# Patient Record
Sex: Female | Born: 1995 | Race: White | Hispanic: No | Marital: Single | Smoking: Never smoker
Health system: Southern US, Community
[De-identification: ages and names within clinical notes are randomized; demographics above are authoritative.]

## PROBLEM LIST (undated history)

## (undated) DIAGNOSIS — F32A Depression, unspecified: Secondary | ICD-10-CM

## (undated) DIAGNOSIS — F419 Anxiety disorder, unspecified: Secondary | ICD-10-CM

## (undated) DIAGNOSIS — I471 Supraventricular tachycardia, unspecified: Secondary | ICD-10-CM

## (undated) DIAGNOSIS — O039 Complete or unspecified spontaneous abortion without complication: Secondary | ICD-10-CM

## (undated) DIAGNOSIS — F909 Attention-deficit hyperactivity disorder, unspecified type: Secondary | ICD-10-CM

## (undated) DIAGNOSIS — R51 Headache: Secondary | ICD-10-CM

## (undated) DIAGNOSIS — F913 Oppositional defiant disorder: Secondary | ICD-10-CM

## (undated) DIAGNOSIS — F329 Major depressive disorder, single episode, unspecified: Secondary | ICD-10-CM

## (undated) DIAGNOSIS — I10 Essential (primary) hypertension: Secondary | ICD-10-CM

## (undated) HISTORY — DX: Headache: R51

## (undated) HISTORY — DX: Depression, unspecified: F32.A

## (undated) HISTORY — DX: Major depressive disorder, single episode, unspecified: F32.9

## (undated) HISTORY — DX: Complete or unspecified spontaneous abortion without complication: O03.9

## (undated) HISTORY — DX: Anxiety disorder, unspecified: F41.9

## (undated) HISTORY — DX: Oppositional defiant disorder: F91.3

## (undated) HISTORY — DX: Attention-deficit hyperactivity disorder, unspecified type: F90.9

## (undated) HISTORY — DX: Supraventricular tachycardia, unspecified: I47.10

## (undated) HISTORY — PX: NO PAST SURGERIES: SHX2092

---
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
## 2016-11-12 ENCOUNTER — Encounter: Payer: Self-pay | Admitting: Orthopedic Surgery

## 2016-12-10 ENCOUNTER — Other Ambulatory Visit (HOSPITAL_COMMUNITY)
Admission: RE | Admit: 2016-12-10 | Discharge: 2016-12-10 | Disposition: A | Discharge status: Home / Self Care | Payer: Medicaid Other | Source: Ambulatory Visit | Attending: Family Medicine | Admitting: Family Medicine

## 2016-12-10 DIAGNOSIS — R69 Illness, unspecified: Secondary | ICD-10-CM | POA: Insufficient documentation

## 2016-12-10 LAB — CBC WITH DIFFERENTIAL/PLATELET
BASOS ABS: 0.8 10*3/uL — AB (ref 0.0–0.1)
Basophils Relative: 5 %
EOS ABS: 0.1 10*3/uL (ref 0.0–0.7)
Eosinophils Relative: 0 %
HCT: 40.6 % (ref 36.0–46.0)
HEMOGLOBIN: 13.3 g/dL (ref 12.0–15.0)
Lymphocytes Relative: 65 %
Lymphs Abs: 10.8 10*3/uL — ABNORMAL HIGH (ref 0.7–4.0)
MCH: 28.4 pg (ref 26.0–34.0)
MCHC: 32.8 g/dL (ref 30.0–36.0)
MCV: 86.8 fL (ref 78.0–100.0)
Monocytes Absolute: 1.7 10*3/uL — ABNORMAL HIGH (ref 0.1–1.0)
Monocytes Relative: 10 %
NEUTROS ABS: 3.3 10*3/uL (ref 1.7–7.7)
NEUTROS PCT: 20 %
PLATELETS: 222 10*3/uL (ref 150–400)
RBC: 4.68 MIL/uL (ref 3.87–5.11)
RDW: 14.7 % (ref 11.5–15.5)
WBC: 16.7 10*3/uL — ABNORMAL HIGH (ref 4.0–10.5)

## 2016-12-10 LAB — MONONUCLEOSIS SCREEN: MONO SCREEN: POSITIVE — AB

## 2017-06-29 ENCOUNTER — Other Ambulatory Visit (HOSPITAL_COMMUNITY): Payer: Self-pay | Admitting: Family Medicine

## 2017-06-29 DIAGNOSIS — R1011 Right upper quadrant pain: Secondary | ICD-10-CM

## 2017-07-20 ENCOUNTER — Ambulatory Visit (HOSPITAL_COMMUNITY)
Admission: RE | Admit: 2017-07-20 | Discharge: 2017-07-20 | Disposition: A | Discharge status: Home / Self Care | Payer: Medicaid Other | Source: Ambulatory Visit | Attending: Family Medicine | Admitting: Family Medicine

## 2017-07-20 DIAGNOSIS — R1011 Right upper quadrant pain: Secondary | ICD-10-CM | POA: Diagnosis not present

## 2017-10-06 ENCOUNTER — Encounter: Payer: Self-pay | Admitting: Orthopaedic Surgery

## 2017-12-05 ENCOUNTER — Encounter

## 2017-12-05 ENCOUNTER — Ambulatory Visit: Payer: Medicaid Other | Admitting: Orthopedic Surgery

## 2017-12-05 ENCOUNTER — Other Ambulatory Visit: Payer: Self-pay | Admitting: Orthopedic Surgery

## 2017-12-05 ENCOUNTER — Encounter: Payer: Self-pay | Admitting: Orthopedic Surgery

## 2017-12-05 ENCOUNTER — Ambulatory Visit (INDEPENDENT_AMBULATORY_CARE_PROVIDER_SITE_OTHER): Payer: Medicaid Other

## 2017-12-05 ENCOUNTER — Ambulatory Visit: Payer: Medicaid Other

## 2017-12-05 VITALS — BP 115/73 | HR 81 | Ht 62.5 in | Wt 254.0 lb

## 2017-12-05 DIAGNOSIS — M25571 Pain in right ankle and joints of right foot: Secondary | ICD-10-CM | POA: Diagnosis not present

## 2017-12-05 DIAGNOSIS — M76822 Posterior tibial tendinitis, left leg: Secondary | ICD-10-CM

## 2017-12-05 DIAGNOSIS — M79672 Pain in left foot: Secondary | ICD-10-CM

## 2017-12-05 DIAGNOSIS — Z6841 Body Mass Index (BMI) 40.0 and over, adult: Secondary | ICD-10-CM

## 2017-12-05 NOTE — Progress Notes (Signed)
NEW PATIENT OFFICE VISIT  Chief Complaint  Patient presents with  . Foot Pain    Left foot pain for years getting worse in past month    HPI 22 year old female injured her ankle it several months ago then since that time she is noted medial knee pain and swelling occasional severe pain occasional giving way presents for evaluation and treatment.  Rates her pain at its worst 8 ROS  Denies numbness tingling complains of some weakness in the lower extremity history of patellofemoral dislocation subluxation Past Medical History:  Diagnosis Date  . ADHD (attention deficit hyperactivity disorder)   . Anxiety   . Depression   . Headache(784.0)   . Oppositional defiant disorder     No past surgical history on file.  Family History  Problem Relation Age of Onset  . Bipolar disorder Mother   . Anxiety disorder Mother   . Arthritis Mother   . Drug abuse Mother   . Physical abuse Mother   . Depression Maternal Aunt   . Depression Maternal Grandmother   . Heart disease Maternal Grandmother   . Cancer Maternal Grandmother   . Arthritis Unknown   . Cancer Unknown   . Asthma Unknown   . Diabetes Unknown   . Kidney disease Unknown   . Heart disease Maternal Grandfather   . Alcohol abuse Maternal Grandfather        MGGF  . Dementia Maternal Grandfather   . OCD Maternal Grandfather        hording  . ADD / ADHD Cousin   . Paranoid behavior Neg Hx   . Schizophrenia Neg Hx   . Seizures Neg Hx   . Sexual abuse Neg Hx    Social History   Tobacco Use  . Smoking status: Never Smoker  . Smokeless tobacco: Never Used  Substance Use Topics  . Alcohol use: No  . Drug use: No    No Known Allergies  Current Meds  Medication Sig  . cetirizine (ZYRTEC) 10 MG tablet Take 10 mg by mouth daily.  Marland Kitchen FLUoxetine (PROZAC) 20 MG capsule TK 1 C PO QD FOR DEPRESSION  . FOCALIN XR 20 MG 24 hr capsule TK ONE C PO  QAM  . LOW-OGESTREL 0.3-30 MG-MCG tablet TK 1 T PO QD  .  neomycin-polymyxin-hydrocortisone (CORTISPORIN) 3.5-10000-1 OTIC suspension INSTILL 4 DROPS INTO EACH EAR CANAL QID FOR 5 DAYS    BP 115/73   Pulse 81   Ht 5' 2.5" (1.588 m)   Wt 254 lb (115.2 kg)   LMP 11/22/2017   BMI 45.72 kg/m   Physical Exam  Constitutional: She is oriented to person, place, and time. She appears well-developed and well-nourished.  Neurological: She is alert and oriented to person, place, and time.  Psychiatric: She has a normal mood and affect. Judgment normal.  Vitals reviewed.   Ortho Exam Gait is normal  Both feet seem the same they are both flat they are flexible on tiptoe standing full range of motion bilaterally no instability at either ankle normal strength on the right ankle weakness in the posterior tibial tendon on the left other musculature is normal skin is normal on both legs she has good pulses bilaterally normal sensation in each foot  MEDICAL DECISION SECTION  Xrays were done at [facility] orthopedics show no fracture dislocation or abnormal articular surfaces  My independent reading of xrays:  Right ankle films were done previously they look normal  Encounter Diagnoses  Name Primary?  . Pain  in joint involving right ankle and foot Yes  . Posterior tibial tendon dysfunction (PTTD) of left lower extremity     PLAN: (Rx., injectx, surgery, frx, mri/ct) Recommend [facility] Walker for 6 weeks come back for reevaluation  No orders of the defined types were placed in this encounter.   Fuller Canada, MD  12/05/2017 3:00 PM

## 2017-12-05 NOTE — Patient Instructions (Signed)

## 2018-01-16 ENCOUNTER — Ambulatory Visit: Payer: Medicaid Other | Admitting: Orthopedic Surgery

## 2018-01-16 ENCOUNTER — Encounter: Payer: Self-pay | Admitting: Orthopedic Surgery

## 2018-01-17 ENCOUNTER — Encounter: Payer: Self-pay | Admitting: Cardiovascular Disease

## 2018-06-16 ENCOUNTER — Telehealth: Payer: Self-pay | Admitting: *Deleted

## 2018-06-16 NOTE — Telephone Encounter (Signed)
Spoke with female to let pt know we will call as close to appt time on Monday as possible. JSY

## 2018-06-19 ENCOUNTER — Encounter: Payer: Self-pay | Admitting: Adult Health

## 2018-06-19 ENCOUNTER — Other Ambulatory Visit: Payer: Self-pay

## 2018-06-19 ENCOUNTER — Ambulatory Visit (INDEPENDENT_AMBULATORY_CARE_PROVIDER_SITE_OTHER): Payer: Self-pay | Admitting: Adult Health

## 2018-06-19 VITALS — Ht 62.5 in

## 2018-06-19 DIAGNOSIS — O3680X0 Pregnancy with inconclusive fetal viability, not applicable or unspecified: Secondary | ICD-10-CM

## 2018-06-19 DIAGNOSIS — Z3201 Encounter for pregnancy test, result positive: Secondary | ICD-10-CM

## 2018-06-19 DIAGNOSIS — Z3A01 Less than 8 weeks gestation of pregnancy: Secondary | ICD-10-CM

## 2018-06-19 HISTORY — DX: Encounter for pregnancy test, result positive: Z32.01

## 2018-06-19 HISTORY — DX: Pregnancy with inconclusive fetal viability, not applicable or unspecified: O36.80X0

## 2018-06-19 NOTE — Progress Notes (Signed)
Patient ID: Anna Montgomery, female   DOB: September 19, 1995, 23 y.o.   MRN: [id number]   TELEHEALTH VIRTUAL GYNECOLOGY VISIT ENCOUNTER NOTE  I connected with Anna Montgomery on 06/19/18 at  9:45 AM EDT by telephone at home and verified that I am speaking with the correct person using two identifiers.   I discussed the limitations, risks, security and privacy concerns of performing an evaluation and management service by telephone and the availability of in person appointments. I also discussed with the patient that there may be a patient responsible charge related to this service. The patient expressed understanding and agreed to proceed.   History:  Anna Montgomery is a 23 y.o. G1P0 female being evaluated today for having missed a period and has had 3+HPTs. Her LMP was 05/14/2018, so she is about 5+1 week [pregnant and due about 02/18/19.She has been sleepy, had cramping and breast tenderness, reassured this is symptoms of early pregnancy, along with being moody and having to pee a lot.  Mom was with her and voiced that Anna Montgomery has had elevated BP and borderline sugar issues in the past.Whcih we will address at [facility] visit.  She denies any abnormal vaginal discharge, bleeding, or other concerns.       Past Medical History:  Diagnosis Date  . ADHD (attention deficit hyperactivity disorder)   . Anxiety   . Depression   . Headache(784.0)   . Oppositional defiant disorder    History reviewed. No pertinent surgical history. The following portions of the patient's history were reviewed and updated as appropriate: allergies, current medications, past family history, past medical history, past social history, past surgical history and problem list.   Health Maintenance:  Normal pap at [facility].  Review of Systems:  Pertinent items noted in HPI and remainder of comprehensive ROS otherwise negative. Ht 5' 2.5" (1.588 m)   LMP 05/14/2018   BMI 45.72 kg/m per pt.  Fall risk is low.   Physical Exam:   General:   Alert, oriented and cooperative.   Mental Status: Normal mood and affect perceived. Normal judgment and thought content.  Physical exam deferred due to nature of the encounter  Labs and Imaging No results found for this or any previous visit (from the past 336 hour(s)). No results found.    Assessment and Plan:     1. Positive pregnancy test 3+HPTs  2. Encounter to determine fetal viability of pregnancy, single or unspecified fetus Will schedule dating [place] - US OB Comp Less 14 Wks; Future, ina bout 3 weeks   3. Less than [redacted] weeks gestation of pregnancy Continue PNV Ok to take Prozac Pt aware that deliveries at [facility]       I discussed the assessment and treatment plan with the patient. The patient was provided an opportunity to ask questions and all were answered. The patient agreed with the plan and demonstrated an understanding of the instructions.   The patient was advised to call back or seek an in-person evaluation/go to the ED if the symptoms worsen or if the condition fails to improve as anticipated.  I provided  8 minutes of non-face-to-face time during this encounter.   Cyril Mourning, NP [facility] [employer], [facility]

## 2018-07-10 ENCOUNTER — Other Ambulatory Visit: Payer: Medicaid Other

## 2018-07-11 ENCOUNTER — Other Ambulatory Visit: Payer: Self-pay

## 2018-07-11 ENCOUNTER — Other Ambulatory Visit: Payer: Self-pay | Admitting: Adult Health

## 2018-07-11 ENCOUNTER — Ambulatory Visit (INDEPENDENT_AMBULATORY_CARE_PROVIDER_SITE_OTHER): Payer: Medicaid Other

## 2018-07-11 DIAGNOSIS — O3680X0 Pregnancy with inconclusive fetal viability, not applicable or unspecified: Secondary | ICD-10-CM

## 2018-07-11 DIAGNOSIS — Z3A08 8 weeks gestation of pregnancy: Secondary | ICD-10-CM

## 2018-07-11 NOTE — Progress Notes (Signed)
[place] 6+1 wks,single IUP w/ys,low lying gestational sac,fhr 98 bpm,normal ovaries bilat,CRL 45.9 mm,discussed results with Victorino Dike, patient will come back for f/u ultrasound in 10 days.

## 2018-07-24 ENCOUNTER — Inpatient Hospital Stay (HOSPITAL_COMMUNITY)
Admission: AD | Admit: 2018-07-24 | Discharge: 2018-07-24 | Disposition: A | Discharge status: Home / Self Care | Payer: BLUE CROSS/BLUE SHIELD | Attending: Obstetrics and Gynecology | Admitting: Obstetrics and Gynecology

## 2018-07-24 ENCOUNTER — Inpatient Hospital Stay (HOSPITAL_COMMUNITY): Payer: BLUE CROSS/BLUE SHIELD

## 2018-07-24 ENCOUNTER — Telehealth: Payer: Self-pay | Admitting: *Deleted

## 2018-07-24 ENCOUNTER — Other Ambulatory Visit: Payer: Self-pay

## 2018-07-24 ENCOUNTER — Encounter (HOSPITAL_COMMUNITY): Payer: Self-pay

## 2018-07-24 DIAGNOSIS — O039 Complete or unspecified spontaneous abortion without complication: Secondary | ICD-10-CM | POA: Insufficient documentation

## 2018-07-24 DIAGNOSIS — O209 Hemorrhage in early pregnancy, unspecified: Secondary | ICD-10-CM

## 2018-07-24 DIAGNOSIS — Z3A08 8 weeks gestation of pregnancy: Secondary | ICD-10-CM | POA: Diagnosis not present

## 2018-07-24 HISTORY — DX: Essential (primary) hypertension: I10

## 2018-07-24 LAB — URINALYSIS, ROUTINE W REFLEX MICROSCOPIC
Bilirubin Urine: NEGATIVE
Glucose, UA: NEGATIVE mg/dL
Hgb urine dipstick: NEGATIVE
Ketones, ur: NEGATIVE mg/dL
Nitrite: NEGATIVE
Protein, ur: NEGATIVE mg/dL
Specific Gravity, Urine: 1.015 (ref 1.005–1.030)
pH: 6 (ref 5.0–8.0)

## 2018-07-24 LAB — WET PREP, GENITAL
Sperm: NONE SEEN
Trich, Wet Prep: NONE SEEN
Yeast Wet Prep HPF POC: NONE SEEN

## 2018-07-24 MED ORDER — HYDROXYZINE PAMOATE 50 MG PO CAPS: 50.0000 mg | ORAL_CAPSULE | Freq: Three times a day (TID) | ORAL | 0 refills | Status: DC | PRN

## 2018-07-24 MED ORDER — IBUPROFEN 600 MG PO TABS: 600.0000 mg | ORAL_TABLET | Freq: Four times a day (QID) | ORAL | 0 refills | Status: DC | PRN

## 2018-07-24 NOTE — MAU Note (Signed)
Pt reports some light vag. Bleeding for the past week.  Pt also reports some left sided abd cramping.  Pt denies any recent intercourse.  Pt reports lifting heavy objects at work last week before bleeding started.

## 2018-07-24 NOTE — Discharge Instructions (Signed)
Miscarriage  A miscarriage is the loss of an unborn baby (fetus) before the 20th week of pregnancy.  Follow these instructions at home:  Medicines    · Take over-the-counter and prescription medicines only as told by your doctor.  · If you were prescribed antibiotic medicine, take it as told by your doctor. Do not stop taking the antibiotic even if you start to feel better.  · Do not take NSAIDs unless your doctor says that this is safe for you. NSAIDs include aspirin and ibuprofen. These medicines can cause bleeding.  Activity  · Rest as directed. Ask your doctor what activities are safe for you.  · Have someone help you at home during this time.  General instructions  · Write down how many pads you use each day and how soaked they are.  · Watch the amount of tissue or clumps of blood (blood clots) that you pass from your vagina. Save any large amounts of tissue for your doctor.  · Do not use tampons, douche, or have sex until your doctor approves.  · To help you and your partner with the process of grieving, talk with your doctor or seek counseling.  · When you are ready, meet with your doctor to talk about steps you should take for your health. Also, talk with your doctor about steps to take to have a healthy pregnancy in the future.  · Keep all follow-up visits as told by your doctor. This is important.  Contact a doctor if:  · You have a fever or chills.  · You have vaginal discharge that smells bad.  · You have more bleeding.  Get help right away if:  · You have very bad cramps or pain in your back or belly.  · You pass clumps of blood that are walnut-sized or larger from your vagina.  · You pass tissue that is walnut-sized or larger from your vagina.  · You soak more than 1 regular pad in an hour.  · You get light-headed or weak.  · You faint (pass out).  · You have feelings of sadness that do not go away, or you have thoughts of hurting yourself.  Summary  · A miscarriage is the loss of an unborn baby before  the 20th week of pregnancy.  · Follow your doctor's instructions for home care. Keep all follow-up appointments.  · To help you and your partner with the process of grieving, talk with your doctor or seek counseling.  This information is not intended to replace advice given to you by your health care provider. Make sure you discuss any questions you have with your health care provider.  Document Released: 04/19/2011 Document Revised: 03/02/2016 Document Reviewed: 03/02/2016  Elsevier Interactive Patient Education © 2019 [employer]

## 2018-07-24 NOTE — Telephone Encounter (Signed)
Left message letting pt know no visitors or children at tomorrow's appt. Call office for check in. Wear a mask. Advised if pt has come in contact with someone in the last month that has been confirmed or suspected of having Covid-19 or if she is experiencing any symptoms, call office and reschedule. Wood

## 2018-07-24 NOTE — MAU Provider Note (Signed)
History     CSN: [id number]  Arrival date and time: 07/24/18 1338   First Provider Initiated Contact with Patient 07/24/18 1508      Chief Complaint  Patient presents with  . Vaginal Bleeding  . Abdominal Pain   HPI   Anna Montgomery is a 23 y.o. female G1P0 @ 7854w0d here in MAU with complaints of abnormal vaginal bleeding. Says over the last week she has noticed spotting every time she uses the bathroom. The spotting is dark, and very light. She has had cramping on her left side only. The cramping is present everyday. She currently rates her pain 5/10.  US in the office on 6/2 shows IUP with cardiac activity.   OB History    Gravida  1   Para      Term      Preterm      AB      Living        SAB      TAB      Ectopic      Multiple      Live Births              Past Medical History:  Diagnosis Date  . ADHD (attention deficit hyperactivity disorder)   . Anxiety   . Depression   . Headache(784.0)   . Hypertension   . Oppositional defiant disorder     Past Surgical History:  Procedure Laterality Date  . NO PAST SURGERIES      Family History  Problem Relation Age of Onset  . Bipolar disorder Mother   . Anxiety disorder Mother   . Arthritis Mother   . Drug abuse Mother   . Physical abuse Mother   . Depression Maternal Aunt   . Depression Maternal Grandmother   . Heart disease Maternal Grandmother   . Cancer Maternal Grandmother   . Arthritis Other   . Cancer Other   . Asthma Other   . Diabetes Other   . Kidney disease Other   . Heart disease Maternal Grandfather   . Alcohol abuse Maternal Grandfather        MGGF  . Dementia Maternal Grandfather   . OCD Maternal Grandfather        hording  . ADD / ADHD Cousin   . Paranoid behavior Neg Hx   . Schizophrenia Neg Hx   . Seizures Neg Hx   . Sexual abuse Neg Hx     Social History   Tobacco Use  . Smoking status: Never Smoker  . Smokeless tobacco: Never Used  Substance Use Topics  .  Alcohol use: No  . Drug use: No    Allergies: No Known Allergies  Medications Prior to Admission  Medication Sig Dispense Refill Last Dose  . cetirizine (ZYRTEC) 10 MG tablet Take 10 mg by mouth daily.     Marland Kitchen. FLUoxetine (PROZAC) 20 MG capsule TK 1 C PO QD FOR DEPRESSION  11   . neomycin-polymyxin-hydrocortisone (CORTISPORIN) 3.5-10000-1 OTIC suspension as needed.   2   . Prenatal Vit-Fe Fumarate-FA (PRENATAL VITAMIN PO) [facility] daily. [facility]      Results for orders placed or performed during the hospital encounter of 07/24/18 (from the past 48 hour(s))  Wet prep, genital     Status: Abnormal   Collection Time: 07/24/18  3:20 PM   Specimen: Vaginal  Result Value Ref Range   Yeast Wet Prep HPF POC NONE SEEN NONE SEEN   Trich, Wet  Prep NONE SEEN NONE SEEN   Clue Cells Wet Prep HPF POC PRESENT (A) NONE SEEN   WBC, Wet Prep HPF POC MANY (A) NONE SEEN   Sperm NONE SEEN     Comment: Performed at [facility], [address] [place], [place] [address]  Urinalysis, Routine w reflex microscopic     Status: Abnormal   Collection Time: 07/24/18  3:36 PM  Result Value Ref Range   Color, Urine YELLOW YELLOW   APPearance CLEAR CLEAR   Specific Gravity, Urine 1.015 1.005 - 1.030   pH 6.0 5.0 - 8.0   Glucose, UA NEGATIVE NEGATIVE mg/dL   Hgb urine dipstick NEGATIVE NEGATIVE   Bilirubin Urine NEGATIVE NEGATIVE   Ketones, ur NEGATIVE NEGATIVE mg/dL   Protein, ur NEGATIVE NEGATIVE mg/dL   Nitrite NEGATIVE NEGATIVE   Leukocytes,Ua TRACE (A) NEGATIVE   RBC / HPF 0-5 0 - 5 RBC/hpf   WBC, UA 0-5 0 - 5 WBC/hpf   Bacteria, UA RARE (A) NONE SEEN   Squamous Epithelial / LPF 0-5 0 - 5    Comment: Performed at [facility], [address] [place], [place] [address]  ABO/Rh     Status: None   Collection Time: 07/24/18  3:37 PM  Result Value Ref Range   ABO/RH(D) B POS    No rh immune globuloin      NOT A RH IMMUNE GLOBULIN CANDIDATE, PT RH POSITIVE Performed at [facility], [address] [place], [place] [address]    [place] Ob Transvaginal  Result Date: 07/24/2018 CLINICAL DATA:  Vaginal bleeding.  Last menstrual period 05/14/2018. EXAM: TRANSVAGINAL OB ULTRASOUND TECHNIQUE: Transvaginal ultrasound was performed for complete evaluation of the gestation as well as the maternal uterus, adnexal regions, and pelvic cul-de-sac. COMPARISON:  None. FINDINGS: Intrauterine gestational sac: None Yolk sac:  Not Visualized. Embryo:  Not Visualized. Maternal uterus/adnexae: The endometrial stripe is heterogeneous and thickened to 1.5 cm. Normal appearance of the ovaries. 1.5 cm simple appearing left adnexal cyst noted. IMPRESSION: Heterogeneous thickened endometrial stripe without intrauterine gestational sac identified, probably representing failed pregnancy. Clinical follow-up is recommended. Electronically Signed   By: Ted Mcalpineobrinka  Dimitrova M.D.   On: 07/24/2018 16:40    Review of Systems  Constitutional: Negative for fever.  Gastrointestinal: Negative for abdominal pain.  Genitourinary: Negative for vaginal bleeding.   Physical Exam   Blood pressure 135/63, pulse (!) 111, temperature 98.5 F (36.9 C), temperature source Oral, resp. rate 20, height 5\' 2"  (1.575 m), weight 121.3 kg, last menstrual period 05/14/2018, SpO2 99 %.  Physical Exam  Constitutional: She is oriented to person, place, and time. She appears well-developed and well-nourished. No distress.  HENT:  Head: Normocephalic.  Eyes: Pupils are equal, round, and reactive to light.  GI: Soft. She exhibits no distension. There is no abdominal tenderness. There is no rebound and no guarding.  Genitourinary:    Genitourinary Comments: Cervix Soft, anterior, closed. Small amount of dark red blood noted on exam glove.    Musculoskeletal: Normal range of motion.  Neurological: She is alert and oriented to person, place, and time.  Skin: Skin is warm. She is not diaphoretic.  Psychiatric: Her behavior is  normal.   MAU Course  Procedures  None  MDM B positive blood type CBC US today. HCG  Assessment and Plan   A:  1. SAB (spontaneous abortion)   2. Vaginal bleeding in pregnancy, first trimester     P:  Discharge home in stable condition RX: Vistaril, Ibuprofen  Return to MAU if symptoms worsen Bleeding precautions Support given  I sent a message to [facility] OB office to schedule a F/U appointment for the patient in the next week.   Chellsie Gomer 07/24/2018, 3:10 PM

## 2018-07-25 ENCOUNTER — Other Ambulatory Visit: Payer: Medicaid Other

## 2018-07-25 ENCOUNTER — Ambulatory Visit: Payer: Medicaid Other | Admitting: Adult Health

## 2018-07-25 LAB — ABO/RH: ABO/RH(D): B POS

## 2018-07-26 LAB — GC/CHLAMYDIA PROBE AMP (~~LOC~~) NOT AT ARMC
Chlamydia: NEGATIVE
Neisseria Gonorrhea: NEGATIVE

## 2018-07-27 ENCOUNTER — Other Ambulatory Visit: Payer: Self-pay

## 2018-07-27 ENCOUNTER — Encounter: Payer: Self-pay | Admitting: Adult Health

## 2018-07-27 ENCOUNTER — Ambulatory Visit (INDEPENDENT_AMBULATORY_CARE_PROVIDER_SITE_OTHER): Payer: Medicaid Other | Admitting: Adult Health

## 2018-07-27 VITALS — BP 142/88 | HR 88 | Ht 62.5 in | Wt 272.0 lb

## 2018-07-27 DIAGNOSIS — O039 Complete or unspecified spontaneous abortion without complication: Secondary | ICD-10-CM

## 2018-07-27 DIAGNOSIS — R1032 Left lower quadrant pain: Secondary | ICD-10-CM

## 2018-07-27 DIAGNOSIS — N83202 Unspecified ovarian cyst, left side: Secondary | ICD-10-CM

## 2018-07-27 HISTORY — DX: Complete or unspecified spontaneous abortion without complication: O03.9

## 2018-07-27 NOTE — Progress Notes (Signed)
Patient ID: Anna Montgomery, female   DOB: 03/13/1995, 23 y.o.   MRN: [id number] History of Present Illness: Anna Montgomery is a 23 year old black female, G1P0010, in follow up of having miscarriage, she was seen 6/15 at MAU and US showed thickened endometrium no IUP and simple adnexal cyst, still bleeding. She is complaining of pain in left side. Blood type is B+. PCP is Dr Karie Kirks.   Current Medications, Allergies, Past Medical History, Past Surgical History, Family History and Social History were reviewed in [employer] record.     Review of Systems: Pain in left side Still bleeding     Physical Exam:BP (!) 142/88 (BP Location: Left Arm, Patient Position: Sitting, Cuff Size: Normal)   Pulse 88   Ht 5' 2.5" (1.588 m)   Wt 272 lb (123.4 kg)   LMP 05/14/2018   BMI 48.96 kg/m  General:  Well developed, well nourished, no acute distress Skin:  Warm and dry Pelvic:  External genitalia is normal in appearance, no lesions.  The vagina is normal in appearance. Urethra has no lesions or masses. The cervix is smooth.  Uterus is felt to be normal size, shape, and contour.  No adnexal masses, mild LLQ tenderness noted.Bladder is non tender, no masses felt. Psych:  No mood changes, alert and cooperative,seems happy Fall risk is low. Examination chaperoned by Levy Pupa LPN. Explained that miscarriages can happen in early pregnancy and it is nobody's fault and can't be prevented, this early.   Impression: 1. SAB (spontaneous abortion)   2. LLQ pain   3. Left ovarian cyst       Plan: Check [facility] today Return in 2 weeks for [facility] Note given for work to excuse, 6/15,6/16,6/17 and RTW 6/19, limit lifting to 25 lbs. Use condoms Review handout by Tilden Fossa on Miscarriage

## 2018-07-28 ENCOUNTER — Telehealth: Payer: Self-pay | Admitting: *Deleted

## 2018-07-28 LAB — BETA HCG QUANT (REF LAB): hCG Quant: 6993 m[IU]/mL

## 2018-07-28 NOTE — Telephone Encounter (Signed)
LMOVM to come have repeat hcg drawn on Monday.  Placed on lab schedule.

## 2018-07-31 ENCOUNTER — Other Ambulatory Visit: Payer: Medicaid Other

## 2018-07-31 ENCOUNTER — Telehealth: Payer: Self-pay | Admitting: Adult Health

## 2018-07-31 NOTE — Telephone Encounter (Signed)
Patient states she went to [facility] for bleeding and was found to have had a complete miscarriage. HCG 4478. Informed she is indeed having a miscarriage and we need to follow lab until <5.  Already scheduled for next week, so we will just leave her as scheduled.  Verbalized understanding.

## 2018-07-31 NOTE — Telephone Encounter (Signed)
Pt states she went to [facility] last night and they states she had a complete miscarriage but that her hcg levels were high and they recommend her not to take ibprophen. Pt would like to discuss with a nurse.

## 2018-08-07 ENCOUNTER — Telehealth: Payer: Self-pay | Admitting: Women's Health

## 2018-08-07 ENCOUNTER — Telehealth: Payer: Self-pay | Admitting: *Deleted

## 2018-08-07 NOTE — Telephone Encounter (Signed)
Patient states she has missed work due to pain related to her cyst on her ovary and needs a note. Will pick up this afternoon.

## 2018-08-07 NOTE — Telephone Encounter (Signed)
Patient called stating that she would like to know if we could write her a letter for work, pt states that she has had a miscarriage and her bleeding has slowed down but she is still having pain. Please contact pt

## 2018-08-10 ENCOUNTER — Other Ambulatory Visit: Payer: Medicaid Other

## 2018-08-10 ENCOUNTER — Other Ambulatory Visit: Payer: Self-pay

## 2018-08-10 DIAGNOSIS — O039 Complete or unspecified spontaneous abortion without complication: Secondary | ICD-10-CM

## 2018-08-11 LAB — BETA HCG QUANT (REF LAB): hCG Quant: 405 m[IU]/mL

## 2018-08-14 ENCOUNTER — Telehealth: Payer: Self-pay | Admitting: *Deleted

## 2018-08-14 NOTE — Telephone Encounter (Signed)
LMOVM that she needs to come back in 2 weeks for HCG. Dropped to 405.

## 2020-05-13 ENCOUNTER — Other Ambulatory Visit: Payer: Self-pay | Admitting: Family Medicine

## 2020-05-13 DIAGNOSIS — R10811 Right upper quadrant abdominal tenderness: Secondary | ICD-10-CM

## 2020-05-15 ENCOUNTER — Ambulatory Visit (HOSPITAL_COMMUNITY)
Admission: RE | Admit: 2020-05-15 | Discharge: 2020-05-15 | Disposition: A | Discharge status: Home / Self Care | Payer: Medicaid Other | Source: Ambulatory Visit | Attending: Family Medicine | Admitting: Family Medicine

## 2020-05-15 DIAGNOSIS — R10811 Right upper quadrant abdominal tenderness: Secondary | ICD-10-CM | POA: Insufficient documentation

## 2020-05-23 ENCOUNTER — Other Ambulatory Visit (HOSPITAL_COMMUNITY): Payer: Self-pay | Admitting: Family Medicine

## 2020-05-23 DIAGNOSIS — R1013 Epigastric pain: Secondary | ICD-10-CM

## 2020-06-04 ENCOUNTER — Other Ambulatory Visit (HOSPITAL_COMMUNITY): Payer: Self-pay | Admitting: Family Medicine

## 2020-06-04 ENCOUNTER — Ambulatory Visit (HOSPITAL_COMMUNITY)
Admission: RE | Admit: 2020-06-04 | Discharge: 2020-06-04 | Disposition: A | Discharge status: Home / Self Care | Payer: Medicaid Other | Source: Ambulatory Visit | Attending: Family Medicine | Admitting: Family Medicine

## 2020-06-04 ENCOUNTER — Encounter (HOSPITAL_COMMUNITY): Payer: Self-pay

## 2020-06-04 DIAGNOSIS — R1013 Epigastric pain: Secondary | ICD-10-CM | POA: Insufficient documentation

## 2020-06-04 MED ORDER — MORPHINE SULFATE (PF) 4 MG/ML IV SOLN
4.0000 mg | Freq: Once | INTRAVENOUS | Status: AC
  Administered 2020-06-04: 4 mg via INTRAVENOUS
  Filled 2020-06-04: qty 1

## 2020-06-04 MED ORDER — TECHNETIUM TC 99M MEBROFENIN IV KIT
5.0000 | PACK | Freq: Once | INTRAVENOUS | Status: AC | PRN
  Administered 2020-06-04: 5 via INTRAVENOUS

## 2020-06-04 MED ORDER — MORPHINE SULFATE (PF) 4 MG/ML IV SOLN
INTRAVENOUS | Status: AC
  Filled 2020-06-04: qty 1

## 2020-06-24 ENCOUNTER — Ambulatory Visit: Payer: Medicaid Other | Admitting: General Surgery

## 2020-07-08 ENCOUNTER — Ambulatory Visit (INDEPENDENT_AMBULATORY_CARE_PROVIDER_SITE_OTHER): Payer: Medicaid Other | Admitting: General Surgery

## 2020-07-08 ENCOUNTER — Encounter: Payer: Self-pay | Admitting: General Surgery

## 2020-07-08 ENCOUNTER — Other Ambulatory Visit: Payer: Self-pay

## 2020-07-08 VITALS — BP 136/81 | HR 83 | Temp 98.9°F | Resp 16 | Ht 62.5 in | Wt 298.0 lb

## 2020-07-08 DIAGNOSIS — K8020 Calculus of gallbladder without cholecystitis without obstruction: Secondary | ICD-10-CM

## 2020-07-08 NOTE — Patient Instructions (Signed)
Minimally Invasive Cholecystectomy Minimally invasive cholecystectomy is surgery to remove the gallbladder. The gallbladder is a pear-shaped organ that lies beneath the liver on the right side of the body. The gallbladder stores bile, which is a fluid that helps the body digest fats. Cholecystectomy is often done to treat inflammation of the gallbladder (cholecystitis). This condition is usually caused by a buildup of gallstones (cholelithiasis) in the gallbladder. Gallstones can block the flow of bile, which can result in inflammation and pain. In severe cases, emergency surgery may be required. This procedure is done though small incisions in the abdomen, instead of one large incision. It is also called laparoscopic surgery. A thin scope with a camera (laparoscope) is inserted through one incision. Then surgical instruments are inserted through the other incisions. In some cases, a minimally invasive surgery may need to be changed to a surgery that is done through a larger incision. This is called open surgery. Tell a health care provider about:  Any allergies you have.  All medicines you are taking, including vitamins, herbs, eye drops, creams, and over-the-counter medicines.  Any problems you or family members have had with anesthetic medicines.  Any blood disorders you have.  Any surgeries you have had.  Any medical conditions you have.  Whether you are pregnant or may be pregnant. What are the risks? Generally, this is a safe procedure. However, problems may occur, including:  Infection.  Bleeding.  Allergic reactions to medicines.  Damage to nearby structures or organs.  A stone remaining in the common bile duct. The common bile duct carries bile from the gallbladder into the small intestine.  A bile leak from the cyst duct that is clipped when your gallbladder is removed. What happens before the procedure? Medicines Ask your health care provider about:  Changing or  stopping your regular medicines. This is especially important if you are taking diabetes medicines or blood thinners.  Taking medicines such as aspirin and ibuprofen. These medicines can thin your blood. Do not take these medicines unless your health care provider tells you to take them.  Taking over-the-counter medicines, vitamins, herbs, and supplements. General instructions  Let your health care provider know if you develop a cold or an infection before surgery.  Plan to have someone take you home from the hospital or clinic.  If you will be going home right after the procedure, plan to have someone with you for 24 hours.  Ask your health care provider: ? How your surgery site will be marked. ? What steps will be taken to help prevent infection. These may include:  Removing hair at the surgery site.  Washing skin with a germ-killing soap.  Taking antibiotic medicine. What happens during the procedure?  An IV will be inserted into one of your veins.  You will be given one or both of the following: ? A medicine to help you relax (sedative). ? A medicine to make you fall asleep (general anesthetic).  A breathing tube will be placed in your mouth.  Your surgeon will make several small incisions in your abdomen.  The laparoscope will be inserted through one of the small incisions. The camera on the laparoscope will send images to a monitor in the operating room. This lets your surgeon see inside your abdomen.  A gas will be pumped into your abdomen. This will expand your abdomen to give the surgeon more room to perform the surgery.  Other tools that are needed for the procedure will be inserted through the   other incisions. The gallbladder will be removed through one of the incisions.  Your common bile duct may be examined. If stones are found in the common bile duct, they may be removed.  After your gallbladder has been removed, the incisions will be closed with stitches  (sutures), staples, or skin glue.  Your incisions may be covered with a bandage (dressing). The procedure may vary among health care providers and hospitals.   What happens after the procedure?  Your blood pressure, heart rate, breathing rate, and blood oxygen level will be monitored until you leave the hospital or clinic.  You will be given medicines as needed to control your pain.  If you were given a sedative during the procedure, it can affect you for several hours. Do not drive or operate machinery until your health care provider says that it is safe. Summary  Minimally invasive cholecystectomy, also called laparoscopic cholecystectomy, is surgery to remove the gallbladder using small incisions.  Tell your health care provider about all the medical conditions you have and all the medicines you are taking for those conditions.  Before the procedure, follow instructions about eating or drinking restrictions and changing or stopping medicines.  If you were given a sedative during the procedure, it can affect you for several hours. Do not drive or operate machinery until your health care provider says that it is safe. This information is not intended to replace advice given to you by your health care provider. Make sure you discuss any questions you have with your health care provider. Document Revised: 10/30/2018 Document Reviewed: 10/30/2018 Elsevier Patient Education  2021 Elsevier Inc.  

## 2020-07-09 NOTE — H&P (Signed)
Anna Montgomery; [id number]; 19-May-1995   HPI Patient is a 25 year old black female who was referred to my care by Dr. Gareth Morgan for evaluation and treatment of biliary colic secondary to cholelithiasis.  Patient has been having episodes of reflux, epigastric bloating, right upper quadrant abdominal pain which radiates around to the right shoulder for many months.  She has been seen in the emergency room in the past.  Ultrasound the gallbladder revealed cholelithiasis with a contracted gallbladder.  She did have a HIDA scan which showed nonfilling of the gallbladder.  She states the episodes are intermittent in nature.  Some fatty foods make it worse.  She states that Nexium has been helpful.  She denies any fever, chills, or jaundice.  She currently is asymptomatic.  Patient is currently breast-feeding Past Medical History:  Diagnosis Date  . ADHD (attention deficit hyperactivity disorder)   . Anxiety   . Depression   . Headache(784.0)   . Hypertension   . Miscarriage   . Oppositional defiant disorder     Past Surgical History:  Procedure Laterality Date  . NO PAST SURGERIES      Family History  Problem Relation Age of Onset  . Bipolar disorder Mother   . Anxiety disorder Mother   . Arthritis Mother   . Drug abuse Mother   . Physical abuse Mother   . Depression Maternal Aunt   . Depression Maternal Grandmother   . Heart disease Maternal Grandmother   . Cancer Maternal Grandmother   . Arthritis Other   . Cancer Other   . Asthma Other   . Diabetes Other   . Kidney disease Other   . Heart disease Maternal Grandfather   . Alcohol abuse Maternal Grandfather        MGGF  . Dementia Maternal Grandfather   . OCD Maternal Grandfather        hording  . ADD / ADHD Cousin   . Paranoid behavior Neg Hx   . Schizophrenia Neg Hx   . Seizures Neg Hx   . Sexual abuse Neg Hx     Current Outpatient Medications on File Prior to Visit  Medication Sig Dispense Refill  . Alum  Hydroxide-Mag Carbonate (GAVISCON EXTRA STRENGTH) 160-105 MG CHEW Chew by mouth.    . esomeprazole (NEXIUM) 20 MG capsule Take 40 mg by mouth daily at 12 noon.     No current facility-administered medications on file prior to visit.    No Known Allergies  Social History   Substance and Sexual Activity  Alcohol Use No    Social History   Tobacco Use  Smoking Status Never Smoker  Smokeless Tobacco Never Used    Review of Systems  Constitutional: Positive for malaise/fatigue.  HENT: Negative.   Eyes: Negative.   Respiratory: Negative.   Cardiovascular: Negative.   Gastrointestinal: Positive for abdominal pain, heartburn and nausea.  Genitourinary: Positive for frequency.  Skin: Negative.   Neurological: Negative.   Endo/Heme/Allergies: Negative.   Psychiatric/Behavioral: Negative.     Objective   Vitals:   07/08/20 1325  BP: 136/81  Pulse: 83  Resp: 16  Temp: 98.9 F (37.2 C)  SpO2: 94%    Physical Exam Vitals reviewed.  Constitutional:      Appearance: Normal appearance. She is obese. She is not ill-appearing.  HENT:     Head: Normocephalic and atraumatic.  Eyes:     General: No scleral icterus. Cardiovascular:     Rate and Rhythm: Normal rate and regular rhythm.  Heart sounds: Normal heart sounds. No murmur heard. No friction rub. No gallop.   Pulmonary:     Effort: Pulmonary effort is normal. No respiratory distress.     Breath sounds: Normal breath sounds. No stridor. No wheezing, rhonchi or rales.  Abdominal:     General: Bowel sounds are normal. There is no distension.     Palpations: Abdomen is soft. There is no mass.     Tenderness: There is no abdominal tenderness. There is no guarding or rebound.     Hernia: No hernia is present.  Skin:    General: Skin is warm and dry.  Neurological:     Mental Status: She is alert and oriented to person, place, and time.    Primary care notes reviewed.  Ultrasound and HIDA scan reports  reviewed Assessment  Biliary colic secondary to cholelithiasis Plan   Patient is scheduled for laparoscopic cholecystectomy on 08/04/2020.  The risks and benefits of the procedure including bleeding, infection, hepatobiliary injury, and the possibility of an open procedure were fully explained to the patient, who gave informed consent.  She has been instructed about pumping and storing breastmilk prior to the surgery.

## 2020-07-09 NOTE — Progress Notes (Signed)
Anna Montgomery; [id number]; 19-May-1995   HPI Patient is a 25 year old black female who was referred to my care by Dr. Gareth Morgan for evaluation and treatment of biliary colic secondary to cholelithiasis.  Patient has been having episodes of reflux, epigastric bloating, right upper quadrant abdominal pain which radiates around to the right shoulder for many months.  She has been seen in the emergency room in the past.  Ultrasound the gallbladder revealed cholelithiasis with a contracted gallbladder.  She did have a HIDA scan which showed nonfilling of the gallbladder.  She states the episodes are intermittent in nature.  Some fatty foods make it worse.  She states that Nexium has been helpful.  She denies any fever, chills, or jaundice.  She currently is asymptomatic.  Patient is currently breast-feeding Past Medical History:  Diagnosis Date  . ADHD (attention deficit hyperactivity disorder)   . Anxiety   . Depression   . Headache(784.0)   . Hypertension   . Miscarriage   . Oppositional defiant disorder     Past Surgical History:  Procedure Laterality Date  . NO PAST SURGERIES      Family History  Problem Relation Age of Onset  . Bipolar disorder Mother   . Anxiety disorder Mother   . Arthritis Mother   . Drug abuse Mother   . Physical abuse Mother   . Depression Maternal Aunt   . Depression Maternal Grandmother   . Heart disease Maternal Grandmother   . Cancer Maternal Grandmother   . Arthritis Other   . Cancer Other   . Asthma Other   . Diabetes Other   . Kidney disease Other   . Heart disease Maternal Grandfather   . Alcohol abuse Maternal Grandfather        MGGF  . Dementia Maternal Grandfather   . OCD Maternal Grandfather        hording  . ADD / ADHD Cousin   . Paranoid behavior Neg Hx   . Schizophrenia Neg Hx   . Seizures Neg Hx   . Sexual abuse Neg Hx     Current Outpatient Medications on File Prior to Visit  Medication Sig Dispense Refill  . Alum  Hydroxide-Mag Carbonate (GAVISCON EXTRA STRENGTH) 160-105 MG CHEW Chew by mouth.    . esomeprazole (NEXIUM) 20 MG capsule Take 40 mg by mouth daily at 12 noon.     No current facility-administered medications on file prior to visit.    No Known Allergies  Social History   Substance and Sexual Activity  Alcohol Use No    Social History   Tobacco Use  Smoking Status Never Smoker  Smokeless Tobacco Never Used    Review of Systems  Constitutional: Positive for malaise/fatigue.  HENT: Negative.   Eyes: Negative.   Respiratory: Negative.   Cardiovascular: Negative.   Gastrointestinal: Positive for abdominal pain, heartburn and nausea.  Genitourinary: Positive for frequency.  Skin: Negative.   Neurological: Negative.   Endo/Heme/Allergies: Negative.   Psychiatric/Behavioral: Negative.     Objective   Vitals:   07/08/20 1325  BP: 136/81  Pulse: 83  Resp: 16  Temp: 98.9 F (37.2 C)  SpO2: 94%    Physical Exam Vitals reviewed.  Constitutional:      Appearance: Normal appearance. She is obese. She is not ill-appearing.  HENT:     Head: Normocephalic and atraumatic.  Eyes:     General: No scleral icterus. Cardiovascular:     Rate and Rhythm: Normal rate and regular rhythm.  Heart sounds: Normal heart sounds. No murmur heard. No friction rub. No gallop.   Pulmonary:     Effort: Pulmonary effort is normal. No respiratory distress.     Breath sounds: Normal breath sounds. No stridor. No wheezing, rhonchi or rales.  Abdominal:     General: Bowel sounds are normal. There is no distension.     Palpations: Abdomen is soft. There is no mass.     Tenderness: There is no abdominal tenderness. There is no guarding or rebound.     Hernia: No hernia is present.  Skin:    General: Skin is warm and dry.  Neurological:     Mental Status: She is alert and oriented to person, place, and time.    Primary care notes reviewed.  Ultrasound and HIDA scan reports  reviewed Assessment  Biliary colic secondary to cholelithiasis Plan   Patient is scheduled for laparoscopic cholecystectomy on 08/04/2020.  The risks and benefits of the procedure including bleeding, infection, hepatobiliary injury, and the possibility of an open procedure were fully explained to the patient, who gave informed consent.  She has been instructed about pumping and storing breastmilk prior to the surgery.  

## 2020-07-30 ENCOUNTER — Encounter (HOSPITAL_COMMUNITY): Payer: Self-pay

## 2020-07-30 ENCOUNTER — Encounter (HOSPITAL_COMMUNITY)
Admission: RE | Admit: 2020-07-30 | Discharge: 2020-07-30 | Disposition: A | Discharge status: Home / Self Care | Payer: Medicaid Other | Source: Ambulatory Visit | Attending: General Surgery | Admitting: General Surgery

## 2020-08-04 ENCOUNTER — Ambulatory Visit: Admit: 2020-08-04 | Payer: Medicaid Other | Admitting: General Surgery

## 2020-08-04 SURGERY — LAPAROSCOPIC CHOLECYSTECTOMY
Anesthesia: General

## 2021-08-17 IMAGING — US US ABDOMEN COMPLETE
1 series · 13 of 25 positions shown · non-contrast
Comparison: Ultrasound July 20, 2017.

CLINICAL DATA: One year of epigastric pain

EXAM:
ABDOMEN ULTRASOUND COMPLETE

[Series 1: us abdomen complete · 13 of 98 slices shown]
[im 1/98]
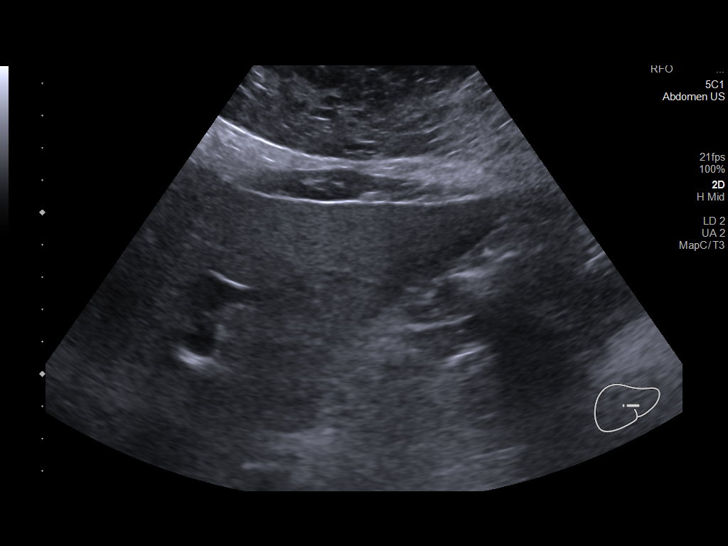
[im 9/98]
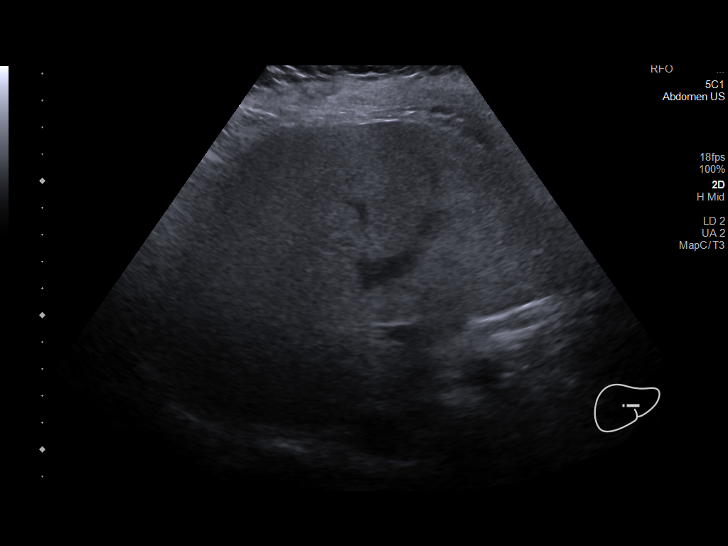
[im 17/98]
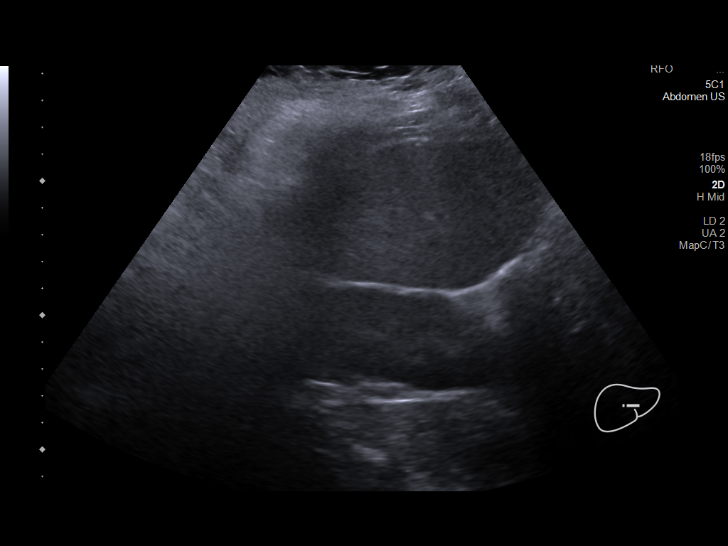
[im 25/98]
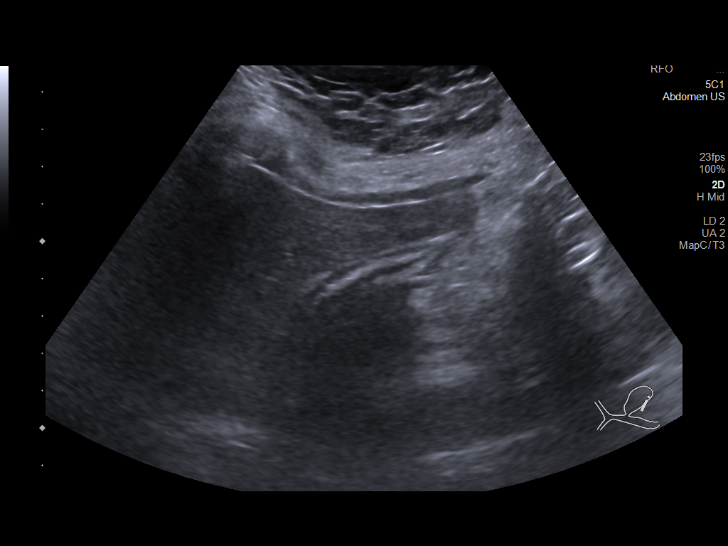
[im 33/98]
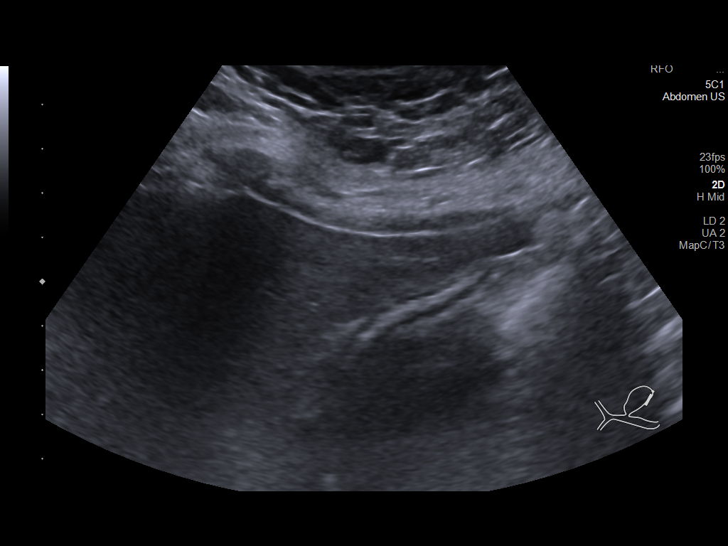
[im 41/98]
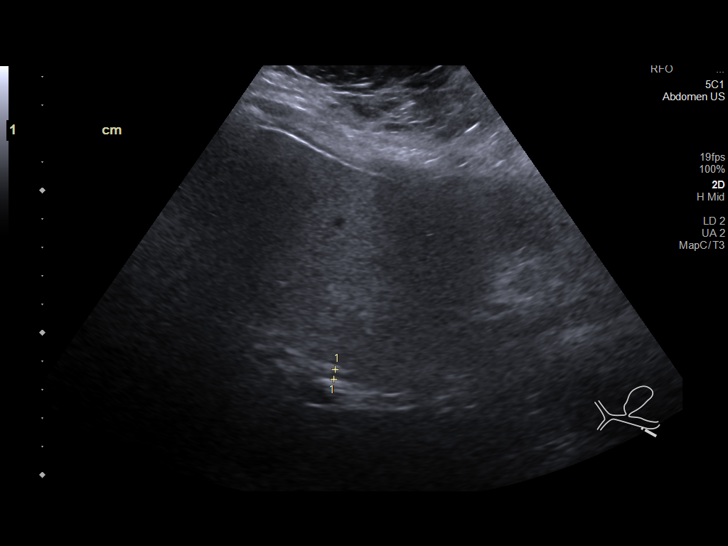
[im 49/98]
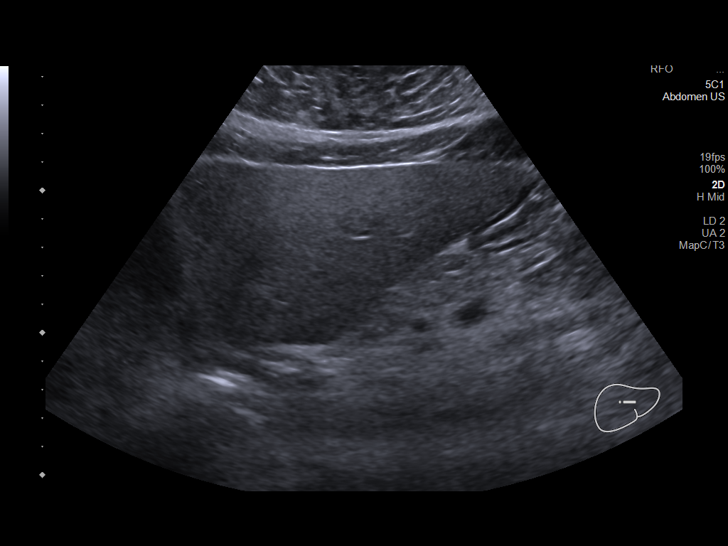
[im 57/98]
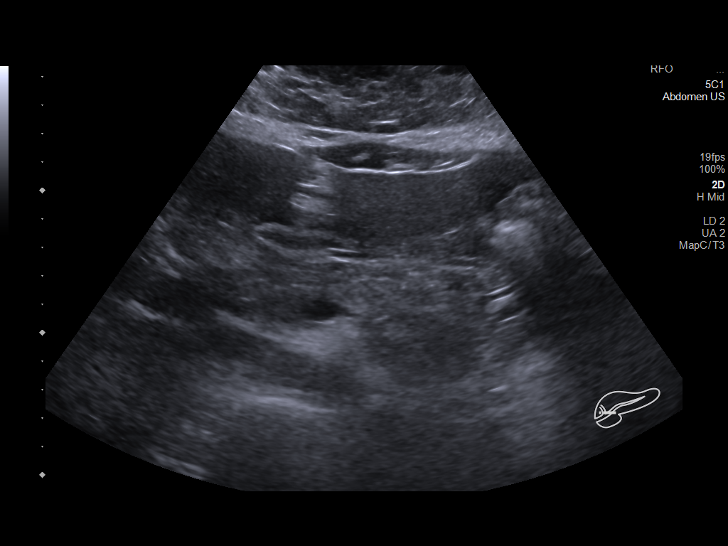
[im 65/98]
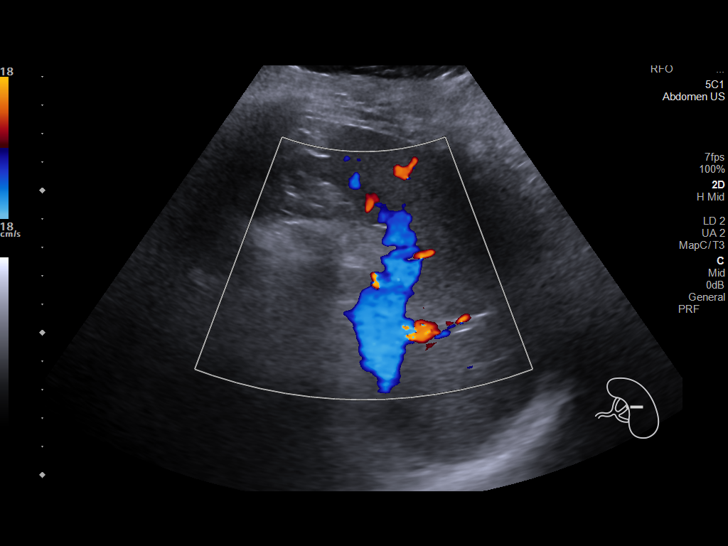
[im 73/98]
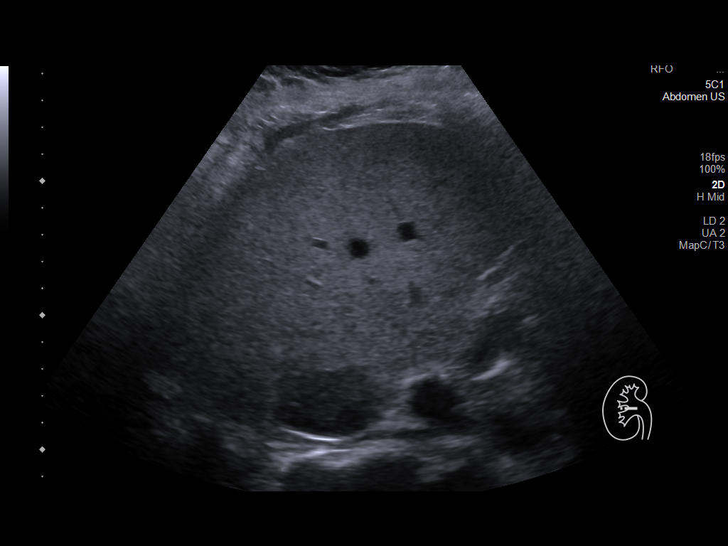
[im 81/98]
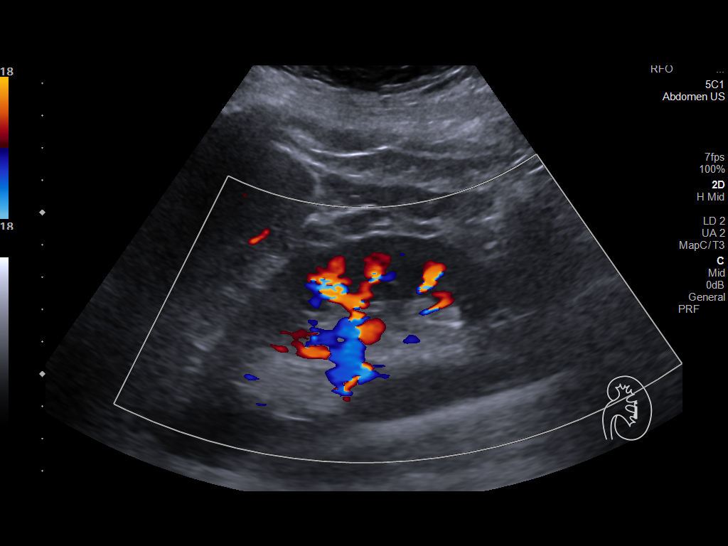
[im 89/98]
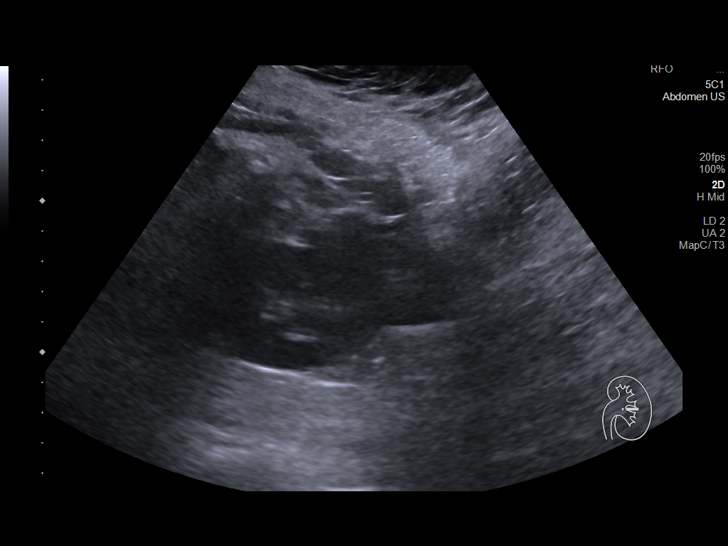
[im 98/98]
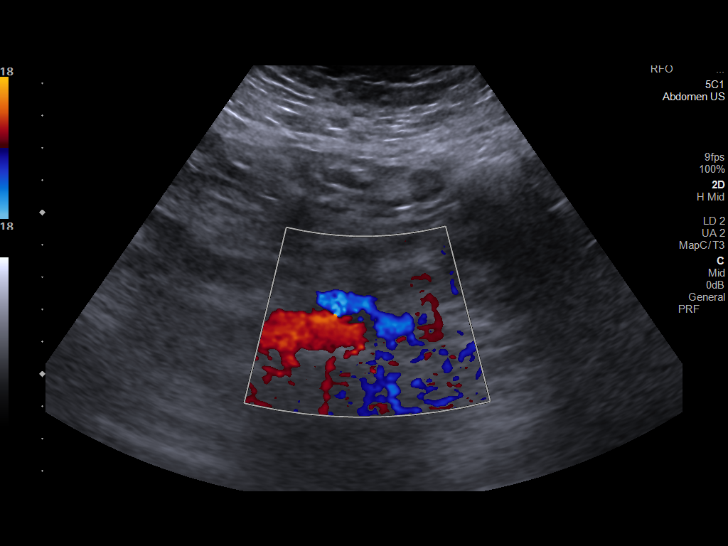

[13 of 25 positions shown; findings below may reference images not displayed]

FINDINGS: Gallbladder: Gallbladder is contracted around cholelithiasis, with
wall thickness at upper limits of normal (3 mm) likely related to
lack of gallbladder distension. No pericholecystic fluid. No
sonographic Murphy sign noted by sonographer.

Common bile duct: Diameter: 5 mm

Liver: No focal lesion identified. Diffusely increased parenchymal
echogenicity. Portal vein is patent on color Doppler imaging with
normal direction of blood flow towards the liver.

IVC: No abnormality visualized.

Pancreas: Visualized portion unremarkable.

Spleen: Size and appearance within normal limits.

Right Kidney: Length: 14 cm. Echogenicity within normal limits. No
mass or hydronephrosis visualized.

Left Kidney: Length: 14 cm. Echogenicity within normal limits. No
mass or hydronephrosis visualized.

Abdominal aorta: No aneurysm visualized.

Other findings: None.
IMPRESSION: 1. The gallbladder is contracted around cholelithiasis. No other
ultrasound evidence of acute cholecystitis. Consider further
evaluation with nuclear medicine HIDA scan if clinical concern for
cholecystitis.
2. The echogenicity of the liver is increased. This is a nonspecific
finding but is most commonly seen with fatty infiltration of the
liver. There are no obvious focal liver lesions.

## 2021-09-06 IMAGING — NM NM HEPATOBILIARY IMAGE, INC GB
2 series · 12 of 12 positions shown · non-contrast
Comparison: May 15, 2020.

CLINICAL DATA: Epigastric abdominal pain.

EXAM:
NUCLEAR MEDICINE HEPATOBILIARY IMAGING
TECHNIQUE: Sequential images of the abdomen were obtained [DATE] minutes
following intravenous administration of radiopharmaceutical. After
no uptake was noted within the gallbladder 1 hour after
administration of radiotracer, 4 mg of morphine was administered
intravenously. Continued delayed imaging was then obtained.
RADIOPHARMACEUTICALS:  5.0 mCi Ec-BBm  Choletec IV

[Series 1: biliary · 3.25mm/px · 6 of 60 frames shown]
[frame 6/60]
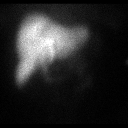
[frame 16/60]
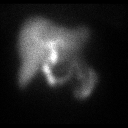
[frame 26/60]
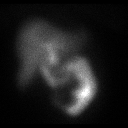
[frame 36/60]
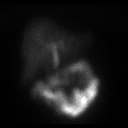
[frame 46/60]
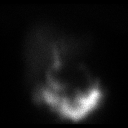
[frame 56/60]
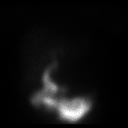

[Series 2: anterior · 3.25mm/px · 6 of 30 frames shown]
[frame 3/30]
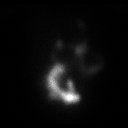
[frame 8/30]
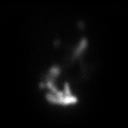
[frame 13/30]
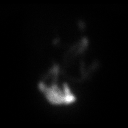
[frame 18/30]
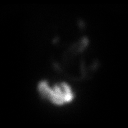
[frame 23/30]
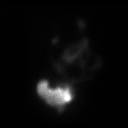
[frame 28/30]
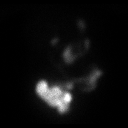

[12 of 12 positions shown; findings below may reference images not displayed]

FINDINGS: Prompt uptake and biliary excretion of activity by the liver is
seen. No gallbladder activity is noted, concerning for cystic duct
obstruction. Biliary activity passes into small bowel, consistent
with patent common bile duct.
IMPRESSION: No gallbladder activity or uptake is noted, concerning for cystic
duct obstruction and possible cholecystitis.

## 2022-12-21 ENCOUNTER — Institutional Professional Consult (permissible substitution): Payer: Medicaid Other | Admitting: Adult Health

## 2023-02-22 ENCOUNTER — Ambulatory Visit (INDEPENDENT_AMBULATORY_CARE_PROVIDER_SITE_OTHER): Payer: Medicaid Other | Admitting: Adult Health

## 2023-02-22 ENCOUNTER — Encounter: Payer: Self-pay | Admitting: Adult Health

## 2023-02-22 VITALS — BP 104/70 | HR 71 | Temp 97.1°F | Ht 62.0 in | Wt 326.2 lb

## 2023-02-22 DIAGNOSIS — I471 Supraventricular tachycardia, unspecified: Secondary | ICD-10-CM | POA: Diagnosis not present

## 2023-02-22 DIAGNOSIS — K219 Gastro-esophageal reflux disease without esophagitis: Secondary | ICD-10-CM

## 2023-02-22 DIAGNOSIS — R0683 Snoring: Secondary | ICD-10-CM | POA: Diagnosis not present

## 2023-02-22 MED ORDER — ESOMEPRAZOLE MAGNESIUM 40 MG PO CPDR: 40.0000 mg | DELAYED_RELEASE_CAPSULE | Freq: Two times a day (BID) | ORAL | 5 refills | Status: AC

## 2023-02-22 NOTE — Patient Instructions (Addendum)
 Set up for home sleep study  Work on healthy weight loss  Do not drive if sleepy.  Increase Nexium 40mg  Twice daily   GERD .  Follow up with Cardiology as discussed for recurrent SVT  Follow up in 6 weeks to discuss results and treatment plan

## 2023-02-22 NOTE — Progress Notes (Signed)
 @Patient  ID: Anna Montgomery, female    DOB: January 08, 1996, 28 y.o.   MRN: [id number]  Chief Complaint  Patient presents with   Consult    Referring provider: Nemiah Kemps, MD  HPI: 28 year old female seen for sleep consult February 22, 2023  TEST/EVENTS :   02/22/2023 Sleep consult  Patient presents for sleep consult today.  Patient says she has had recurrent episodes of SVT that is required emergency room visits.  She was seen in the emergency room August 15, 2022 and January 30, 2023 with SVT.  She was given adenosine during both episodes.  She has been referred to cardiology.  She is on metoprolol.  There is concern for underlying sleep apnea.  She was referred to our office for evaluation.  Patient does have snoring and daytime sleepiness.  Patient says she goes to sleep around 10 PM.  Can take her several hours to go to sleep.  Sleep is very restless.  She is up multiple times throughout the night.  She gets up about 10 AM.  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  Weight is up about 100 pounds over the last couple years.  Current weight is at 326 pounds with a BMI of 59.  Patient does not nap.  Does not use any sleep aids.  Has rare caffeine intake.  Has no history of congestive heart failure or stroke.  Patient says her snoring is very loud at times.  She has no symptoms suspicious for cataplexy or sleep paralysis.  Epworth score is 9 out of 24.  Typically gets sleepy if she sits down to watch TV, rest, passenger of a car and in the evening hours. Does complain of daily reflux.  Has been using Nexium  20mg  on and off but not working very well.  Surgical history none  Social history patient is a never smoker.  No alcohol or drug use.  Patient lives at home with her children and boyfriend.  Patient says her child does sleep in the bed with her.  And is still nursing.     No Known Allergies   There is no immunization history on file for this patient.  Past Medical History:  Diagnosis Date    ADHD (attention deficit hyperactivity disorder)    Anxiety    Depression    Headache(784.0)    Hypertension    Miscarriage    Oppositional defiant disorder     Tobacco History: Social History   Tobacco Use  Smoking Status Never  Smokeless Tobacco Never   Counseling given: Not Answered   Outpatient Medications Prior to Visit  Medication Sig Dispense Refill   Alum Hydroxide-Mag Carbonate (GAVISCON EXTRA RELIEF FORMULA) 508-475 MG/10ML SUSP Take 10-15 mLs by mouth 3 (three) times daily between meals.     docusate sodium (COLACE) 100 MG capsule Take 100 mg by mouth 2 (two) times daily.     esomeprazole  (NEXIUM ) 20 MG capsule Take 40 mg by mouth in the morning.     ibuprofen  (ADVIL ) 800 MG tablet Take 800 mg by mouth 3 (three) times daily as needed for pain.     No facility-administered medications prior to visit.     Review of Systems:   Constitutional:   No  weight loss, night sweats,  Fevers, chills, +fatigue, or  lassitude.  HEENT:   No headaches,  Difficulty swallowing,  Tooth/dental problems, or  Sore throat,                No sneezing, itching, ear  ache, nasal congestion, post nasal drip,   CV:  No chest pain,  Orthopnea, PND, swelling in lower extremities, anasarca, dizziness, palpitations, syncope.   GI  No heartburn, indigestion, abdominal pain, nausea, vomiting, diarrhea, change in bowel habits, loss of appetite, bloody stools.   Resp: .  No chest wall deformity  Skin: no rash or lesions.  GU: no dysuria, change in color of urine, no urgency or frequency.  No flank pain, no hematuria   MS:  No joint pain or swelling.  No decreased range of motion.  No back pain.    Physical Exam  BP 104/70 (BP Location: Left Arm, Patient Position: Sitting, Cuff Size: Large)   Pulse 71   Temp (!) 97.1 F (36.2 C) (Temporal)   Ht 5' 2 (1.575 m)   Wt (!) 326 lb 3.2 oz (148 kg)   SpO2 98%   BMI 59.66 kg/m   GEN: A/Ox3; pleasant , NAD, well nourished    HEENT:   [place]/AT,   NOSE-clear, THROAT-clear, no lesions, no postnasal drip or exudate noted. Class 3 MP airway   NECK:  Supple w/ fair ROM; no JVD; normal carotid impulses w/o bruits; no thyromegaly or nodules palpated; no lymphadenopathy.    RESP  Clear  P & A; w/o, wheezes/ rales/ or rhonchi. no accessory muscle use, no dullness to percussion  CARD:  RRR, no m/r/g, no peripheral edema, pulses intact, no cyanosis or clubbing.  GI:   Soft & nt; nml bowel sounds; no organomegaly or masses detected.   Musco: Warm bil, no deformities or joint swelling noted.   Neuro: alert, no focal deficits noted.    Skin: Warm, no lesions or rashes    Lab Results:    BMET No results found for: NA, K, CL, CO2, GLUCOSE, BUN, CREATININE, CALCIUM, GFRNONAA, GFRAA  BNP No results found for: BNP  ProBNP No results found for: PROBNP  Imaging: No results found.  Administration History     None           No data to display          No results found for: NITRICOXIDE      Assessment & Plan:   No problem-specific Assessment & Plan notes found for this encounter.     Anna Stank, NP 02/22/2023

## 2023-02-23 DIAGNOSIS — I471 Supraventricular tachycardia, unspecified: Secondary | ICD-10-CM | POA: Insufficient documentation

## 2023-02-23 DIAGNOSIS — R0683 Snoring: Secondary | ICD-10-CM | POA: Insufficient documentation

## 2023-02-23 DIAGNOSIS — K219 Gastro-esophageal reflux disease without esophagitis: Secondary | ICD-10-CM | POA: Insufficient documentation

## 2023-02-23 NOTE — Assessment & Plan Note (Signed)
 Recurrent episodes of SVT.  Patient has been seen in the emergency room twice in the last 6 months requiring adenosine. Check home sleep study rule out sleep apnea.  Continue follow-up with cardiology

## 2023-02-23 NOTE — Assessment & Plan Note (Signed)
 Snoring, restless sleep, daytime sleepiness, recurrent episodes of SVT, BMI at 59 all suspicious for underlying sleep apnea.  Will set patient up for home sleep study. Patient is a given on sleep apnea  - discussed how weight can impact sleep and risk for sleep disordered breathing - discussed options to assist with weight loss: combination of diet modification, cardiovascular and strength training exercises   - had an extensive discussion regarding the adverse health consequences related to untreated sleep disordered breathing - specifically discussed the risks for hypertension, coronary artery disease, cardiac dysrhythmias, cerebrovascular disease, and diabetes - lifestyle modification discussed   - discussed how sleep disruption can increase risk of accidents, particularly when driving - safe driving practices were discussed   Plan  Patient Instructions  Set up for home sleep study  Work on healthy weight loss  Do not drive if sleepy.  Increase Nexium  40mg  Twice daily   GERD .  Follow up with Cardiology as discussed for recurrent SVT  Follow up in 6 weeks to discuss results and treatment plan

## 2023-02-23 NOTE — Assessment & Plan Note (Signed)
 GERD symptoms-continue on a GERD diet.  Increase Nexium  to 40 mg twice daily.  Follow-up with primary care.

## 2023-02-28 ENCOUNTER — Ambulatory Visit: Payer: Medicaid Other

## 2023-02-28 DIAGNOSIS — R0683 Snoring: Secondary | ICD-10-CM

## 2023-03-01 ENCOUNTER — Telehealth: Payer: Self-pay | Admitting: Adult Health

## 2023-03-01 NOTE — Telephone Encounter (Signed)
Anna Montgomery states will bring hst machine to office tomorrow. Anna Montgomery phone number is [phone].

## 2023-03-17 ENCOUNTER — Telehealth: Payer: Self-pay | Admitting: Pulmonary Disease

## 2023-03-17 NOTE — Telephone Encounter (Signed)
 Call patient  Sleep study result  Date of study: 02/28/2023  Impression: Moderate obstructive sleep apnea with mild oxygen desaturations  Recommendation: DME referral  Recommend CPAP therapy for moderate obstructive sleep apnea  Auto titrating CPAP with pressure settings of 5-15 will be appropriate  Encourage weight loss measures  Follow-up in the office 4 to 6 weeks following initiation of treatment

## 2023-03-25 ENCOUNTER — Telehealth: Payer: Medicaid Other | Admitting: Adult Health

## 2023-03-25 ENCOUNTER — Encounter: Payer: Self-pay | Admitting: Adult Health

## 2023-03-25 DIAGNOSIS — G4733 Obstructive sleep apnea (adult) (pediatric): Secondary | ICD-10-CM | POA: Diagnosis not present

## 2023-03-25 NOTE — Patient Instructions (Addendum)
Wear CPAP at bedtime at least 6 or more hours each night Work on healthy weight loss Do not drive if sleepy Follow-up in 3 months

## 2023-03-25 NOTE — Progress Notes (Signed)
Virtual Visit via Video Note  I connected with Anna Montgomery on 03/25/23 at  3:30 PM EST by a video enabled telemedicine application and verified that I am speaking with the correct person using two identifiers.  Location: Patient: home  Provider: office    I discussed the limitations of evaluation and management by telemedicine and the availability of in person appointments. The patient expressed understanding and agreed to proceed.  History of Present Illness: 28 year old female seen for sleep consult February 22, 2023 after episode of SVT, snoring and daytime sleepiness found to have moderate obstructive sleep apnea  Today's video visit is to discuss sleep study results.  Patient was seen last month for sleep consult for snoring, daytime sleepiness and also episode of SVT.  She had risk factors for sleep apnea.  She was set up for home sleep study done on March 18, 2023 that showed moderate obstructive sleep apnea with AHI at 19/hour and SpO2 low at 80%.  We discussed her sleep study results in detail went over treatment options including weight loss, oral appliance and CPAP therapy.  Patient would like to go forward with CPAP therapy. Has young children that make sleep pattern difficult at times.     Past Medical History:  Diagnosis Date   ADHD (attention deficit hyperactivity disorder)    Anxiety    Depression    Headache(784.0)    Hypertension    Miscarriage    Oppositional defiant disorder    Current Outpatient Medications on File Prior to Visit  Medication Sig Dispense Refill   Alum Hydroxide-Mag Carbonate (GAVISCON EXTRA RELIEF FORMULA) 508-475 MG/10ML SUSP Take 10-15 mLs by mouth 3 (three) times daily between meals.     docusate sodium (COLACE) 100 MG capsule Take 100 mg by mouth 2 (two) times daily.     esomeprazole (NEXIUM) 40 MG capsule Take 1 capsule (40 mg total) by mouth 2 (two) times daily before a meal. 60 capsule 5   ibuprofen (ADVIL) 800 MG tablet Take 800 mg by  mouth 3 (three) times daily as needed for pain.     No current facility-administered medications on file prior to visit.      Observations/Objective: Home sleep study March 18, 2023 that showed moderate sleep apnea with AHI at 19/hour and SpO2 low at 80%  Assessment and Plan: Moderate obstructive sleep apnea with significant symptom burden and previous episode of SVT-will need to be treated with CPAP therapy.  Begin auto CPAP 5 to 15 cm H2.  Patient education on sleep study, sleep apnea and CPAP care.  - discussed how weight can impact sleep and risk for sleep disordered breathing - discussed options to assist with weight loss: combination of diet modification, cardiovascular and strength training exercises   - had an extensive discussion regarding the adverse health consequences related to untreated sleep disordered breathing - specifically discussed the risks for hypertension, coronary artery disease, cardiac dysrhythmias, cerebrovascular disease, and diabetes - lifestyle modification discussed   - discussed how sleep disruption can increase risk of accidents, particularly when driving - safe driving practices were discussed   Plan  Patient Instructions  Wear CPAP at bedtime at least 6 or more hours each night Work on healthy weight loss Do not drive if sleepy Follow-up in 3 months and as needed    Follow Up Instructions:    I discussed the assessment and treatment plan with the patient. The patient was provided an opportunity to ask questions and all were answered. The patient agreed  with the plan and demonstrated an understanding of the instructions.   The patient was advised to call back or seek an in-person evaluation if the symptoms worsen or if the condition fails to improve as anticipated.  I provided 21  minutes of non-face-to-face time during this encounter.   Rubye Oaks, NP

## 2023-04-01 NOTE — Telephone Encounter (Signed)
HST f/u completed on 03/25/2023.  Order sent to [facility] for CPAP machine.  Nothing further needed.

## 2023-05-30 ENCOUNTER — Ambulatory Visit (INDEPENDENT_AMBULATORY_CARE_PROVIDER_SITE_OTHER): Admitting: Adult Health

## 2023-05-30 ENCOUNTER — Encounter: Payer: Self-pay | Admitting: Adult Health

## 2023-05-30 ENCOUNTER — Ambulatory Visit (INDEPENDENT_AMBULATORY_CARE_PROVIDER_SITE_OTHER)

## 2023-05-30 ENCOUNTER — Other Ambulatory Visit (INDEPENDENT_AMBULATORY_CARE_PROVIDER_SITE_OTHER)

## 2023-05-30 VITALS — BP 118/70 | HR 85 | Ht 62.5 in | Wt 325.8 lb

## 2023-05-30 DIAGNOSIS — J309 Allergic rhinitis, unspecified: Secondary | ICD-10-CM | POA: Insufficient documentation

## 2023-05-30 DIAGNOSIS — M25473 Effusion, unspecified ankle: Secondary | ICD-10-CM | POA: Insufficient documentation

## 2023-05-30 DIAGNOSIS — R0602 Shortness of breath: Secondary | ICD-10-CM | POA: Diagnosis not present

## 2023-05-30 DIAGNOSIS — G4733 Obstructive sleep apnea (adult) (pediatric): Secondary | ICD-10-CM | POA: Insufficient documentation

## 2023-05-30 HISTORY — DX: Effusion, unspecified ankle: M25.473

## 2023-05-30 LAB — BASIC METABOLIC PANEL WITH GFR
BUN: 8 mg/dL (ref 6–23)
CO2: 25 meq/L (ref 19–32)
Calcium: 9.2 mg/dL (ref 8.4–10.5)
Chloride: 101 meq/L (ref 96–112)
Creatinine, Ser: 0.6 mg/dL (ref 0.40–1.20)
GFR: 123.81 mL/min (ref >60.00)
Glucose, Bld: 146 mg/dL — ABNORMAL HIGH (ref 70–99)
Potassium: 4 meq/L (ref 3.5–5.1)
Sodium: 136 meq/L (ref 135–145)

## 2023-05-30 LAB — CBC WITH DIFFERENTIAL/PLATELET
Basophils Absolute: 0.1 10*3/uL (ref 0.0–0.1)
Basophils Relative: 0.9 % (ref 0.0–3.0)
Eosinophils Absolute: 0.3 10*3/uL (ref 0.0–0.7)
Eosinophils Relative: 2.6 % (ref 0.0–5.0)
HCT: 38.2 % (ref 36.0–46.0)
Hemoglobin: 12.3 g/dL (ref 12.0–15.0)
Lymphocytes Relative: 20.3 % (ref 12.0–46.0)
Lymphs Abs: 2.1 10*3/uL (ref 0.7–4.0)
MCHC: 32.2 g/dL (ref 30.0–36.0)
MCV: 80.8 fl (ref 78.0–100.0)
Monocytes Absolute: 0.4 10*3/uL (ref 0.1–1.0)
Monocytes Relative: 4 % (ref 3.0–12.0)
Neutro Abs: 7.5 10*3/uL (ref 1.4–7.7)
Neutrophils Relative %: 72.2 % (ref 43.0–77.0)
Platelets: 361 10*3/uL (ref 150.0–400.0)
RBC: 4.73 Mil/uL (ref 3.87–5.11)
RDW: 16.1 % — ABNORMAL HIGH (ref 11.5–15.5)
WBC: 10.4 10*3/uL (ref 4.0–10.5)

## 2023-05-30 LAB — BRAIN NATRIURETIC PEPTIDE: Pro B Natriuretic peptide (BNP): 9 pg/mL (ref 0.0–100.0)

## 2023-05-30 NOTE — Assessment & Plan Note (Signed)
 Moderate obstructive sleep apnea with perceived benefit on CPAP.  She was encouraged on daily usage and trying to increase her daily usage greater than 6 hours.  May try CPAP mask liners to see if this will help with comfort.  CPAP care discussed in detail.  Plan  Patient Instructions  Wear CPAP at bedtime at least 6 or more hours each night Try Saline nasal gel At bedtime   Cpap mask liners on [employer] or [email or web address]  Work on healthy weight loss Do not drive if sleepy  Chest xray and labs today  Allegra 180mg  At bedtime  As needed  drainage .  Delsym 2 tsp Twice daily  as needed cough  Saline nasal rinses As needed   Follow-up in 3 months and As needed   Please contact office for sooner follow up if symptoms do not improve or worsen or seek emergency care

## 2023-05-30 NOTE — Assessment & Plan Note (Signed)
 Mild bilateral ankle edema questionable etiology.  Does have some associated symptoms with dyspnea and intermittent cough.  Will check chest x-ray and labs including BNP and D-dimer. Advised on a low sodium diet.  May have a component of some venous insufficiency with BMI at 58.  May elevate legs in the afternoon.  Compression hose may be beneficial.   Plan  Patient Instructions  Wear CPAP at bedtime at least 6 or more hours each night Try Saline nasal gel At bedtime   Cpap mask liners on [employer] or [email or web address].[email or web address]-knight.[email or web address]  Work on healthy weight loss Do not drive if sleepy  Chest xray and labs today  Allegra 180mg  At bedtime  As needed  drainage .  Delsym 2 tsp Twice daily  as needed cough  Saline nasal rinses As needed   Follow-up in 3 months and As needed   Please contact office for sooner follow up if symptoms do not improve or worsen or seek emergency care

## 2023-05-30 NOTE — Progress Notes (Signed)
 @Patient  ID: Anna Montgomery, female    DOB: 23-Jan-1996, 28 y.o.   MRN: [id number]  Chief Complaint  Patient presents with   Follow-up    Referring provider: Darliss Ek, MD  HPI: 28 year old female seen for sleep consult January 2025 after episode of SVT.  She was referred for snoring and daytime sleepiness.  She was found to have moderate obstructive sleep apnea  TEST/EVENTS :  home sleep study done on March 18, 2023 that showed moderate obstructive sleep apnea with AHI at 19/hour and SpO2 low at 80%.   05/30/2023 Follow Up OSA  Patient returns for a 67-month follow-up.  Patient was seen last visit after sleep consult in January.  She was found to have moderate obstructive sleep apnea.  She was started on CPAP therapy.  Patient says she is trying to get used to CPAP.  She does feel that when she wears it she does have benefit with decreased daytime sleepiness.  CPAP download shows good compliance at 77% usage.  Daily average usage at 4 hours.  She is on auto CPAP 5 to 16 cm.  AHI is 0.3.  Daily average pressure is 11 cm H2O.  She is using a nasal mask.  Does feel like the mask is irritating at times and moves around.  We did discuss CPAP mask liners.  DME is at for care.  Patient complains over the last couple weeks swelling along her ankles bilaterally.  She has no calf pain.  Has some mild intermittent shortness of breath with activity.  And has an intermittent cough that been going on for several weeks.  She does not feel like that she has got that allergy symptoms with nasal congestion, drainage.  She is a never smoker.  She has 1 dog and 2 cats indoor.  She is a stay-at-home mom.  She did have a roof leak last year and has been working with her landlord to fix where it leaked into the ceiling.  She has not used any medications for cough or allergy symptoms.  She has no history of asthma.  She has no history of VTE or congestive heart failure.  She is not on birth control.  Denies  pregnancy.  No Known Allergies   There is no immunization history on file for this patient.  Past Medical History:  Diagnosis Date   ADHD (attention deficit hyperactivity disorder)    Anxiety    Depression    Headache(784.0)    Hypertension    Miscarriage    Oppositional defiant disorder     Tobacco History: Social History   Tobacco Use  Smoking Status Never  Smokeless Tobacco Never   Counseling given: Not Answered   Outpatient Medications Prior to Visit  Medication Sig Dispense Refill   Alum Hydroxide-Mag Carbonate (GAVISCON EXTRA RELIEF FORMULA) 508-475 MG/10ML SUSP Take 10-15 mLs by mouth 3 (three) times daily between meals.     docusate sodium (COLACE) 100 MG capsule Take 100 mg by mouth 2 (two) times daily.     esomeprazole  (NEXIUM ) 40 MG capsule Take 1 capsule (40 mg total) by mouth 2 (two) times daily before a meal. 60 capsule 5   ibuprofen  (ADVIL ) 800 MG tablet Take 800 mg by mouth 3 (three) times daily as needed for pain.     No facility-administered medications prior to visit.     Review of Systems:   Constitutional:   No  weight loss, night sweats,  Fevers, chills, fatigue, or  lassitude.  HEENT:  No headaches,  Difficulty swallowing,  Tooth/dental problems, or  Sore throat,                No sneezing, itching, ear ache, +nasal congestion, post nasal drip,   CV:  No chest pain,  Orthopnea, PND,anasarca, dizziness, palpitations, syncope.   GI  No heartburn, indigestion, abdominal pain, nausea, vomiting, diarrhea, change in bowel habits, loss of appetite, bloody stools.   Resp:  No chest wall deformity  Skin: no rash or lesions.  GU: no dysuria, change in color of urine, no urgency or frequency.  No flank pain, no hematuria   MS:  No joint pain or swelling.  No decreased range of motion.  No back pain.    Physical Exam  BP 118/70 (BP Location: Left Arm, Patient Position: Sitting, Cuff Size: Normal)   Pulse 85   Ht 5' 2.5" (1.588 m)   Wt (!)  325 lb 12.8 oz (147.8 kg)   SpO2 100%   BMI 58.64 kg/m   GEN: A/Ox3; pleasant , NAD, well nourished    HEENT:  [place]/AT, NOSE-clear, THROAT-clear, no lesions, no postnasal drip or exudate noted.   NECK:  Supple w/ fair ROM; no JVD; normal carotid impulses w/o bruits; no thyromegaly or nodules palpated; no lymphadenopathy.    RESP  Clear  P & A; w/o, wheezes/ rales/ or rhonchi. no accessory muscle use, no dullness to percussion  CARD:  RRR, no m/r/g, min to no ankle  peripheral edema, pulses intact, no cyanosis or clubbing. No calf tendeness  GI:   Soft & nt; nml bowel sounds; no organomegaly or masses detected.   Musco: Warm bil, no deformities or joint swelling noted.   Neuro: alert, no focal deficits noted.    Skin: Warm, no lesions or rashes    Lab Results:    BMET    Component Value Date/Time   NA 136 05/30/2023 1020   K 4.0 05/30/2023 1020   CL 101 05/30/2023 1020   CO2 25 05/30/2023 1020   GLUCOSE 146 (H) 05/30/2023 1020   BUN 8 05/30/2023 1020   CREATININE 0.60 05/30/2023 1020   CALCIUM 9.2 05/30/2023 1020    BNP No results found for: "BNP"  ProBNP    Component Value Date/Time   PROBNP 9.0 05/30/2023 1020    Imaging: DG Chest 2 View Result Date: 05/30/2023 CLINICAL DATA:  Cough and shortness of breath for the past week. EXAM: CHEST - 2 VIEW COMPARISON:  None Available. FINDINGS: The heart size and mediastinal contours are within normal limits. Both lungs are clear. The visualized skeletal structures are unremarkable. IMPRESSION: No active cardiopulmonary disease. Electronically Signed   By: Catherin Closs M.D.   On: 05/30/2023 11:51    Administration History     None           No data to display          No results found for: "NITRICOXIDE"      Assessment & Plan:   OSA (obstructive sleep apnea) Moderate obstructive sleep apnea with perceived benefit on CPAP.  She was encouraged on daily usage and trying to increase her daily usage  greater than 6 hours.  May try CPAP mask liners to see if this will help with comfort.  CPAP care discussed in detail.  Plan  Patient Instructions  Wear CPAP at bedtime at least 6 or more hours each night Try Saline nasal gel At bedtime   Cpap mask liners on Amazon or [email or web address]  Work on healthy weight loss Do not drive if sleepy  Chest xray and labs today  Allegra 180mg  At bedtime  As needed  drainage .  Delsym 2 tsp Twice daily  as needed cough  Saline nasal rinses As needed   Follow-up in 3 months and As needed   Please contact office for sooner follow up if symptoms do not improve or worsen or seek emergency care       Ankle edema Mild bilateral ankle edema questionable etiology.  Does have some associated symptoms with dyspnea and intermittent cough.  Will check chest x-ray and labs including BNP and D-dimer. Advised on a low sodium diet.  May have a component of some venous insufficiency with BMI at 58.  May elevate legs in the afternoon.  Compression hose may be beneficial.   Plan  Patient Instructions  Wear CPAP at bedtime at least 6 or more hours each night Try Saline nasal gel At bedtime   Cpap mask liners on Amazon or [email or web address]  Work on healthy weight loss Do not drive if sleepy  Chest xray and labs today  Allegra 180mg  At bedtime  As needed  drainage .  Delsym 2 tsp Twice daily  as needed cough  Saline nasal rinses As needed   Follow-up in 3 months and As needed   Please contact office for sooner follow up if symptoms do not improve or worsen or seek emergency care       Allergic rhinitis Allergic rhinitis will try Allegra 180 mg daily.  May also try Delsym for cough secondary to postnasal drainage.  Plan  Patient Instructions  Wear CPAP at bedtime at least 6 or more hours each night Try Saline nasal gel At bedtime   Cpap mask liners on Amazon or [email or web address]  Work on healthy weight loss Do not drive if sleepy  Chest xray and labs today  Allegra 180mg  At  bedtime  As needed  drainage .  Delsym 2 tsp Twice daily  as needed cough  Saline nasal rinses As needed   Follow-up in 3 months and As needed   Please contact office for sooner follow up if symptoms do not improve or worsen or seek emergency care       I spent 41   minutes dedicated to the care of this patient on the date of this encounter to include pre-visit review of records, face-to-face time with the patient discussing conditions above, post visit ordering of testing, clinical documentation with the electronic health record, making appropriate referrals as documented, and communicating necessary findings to members of the patients care team.    Roena Clark, NP 05/30/2023

## 2023-05-30 NOTE — Patient Instructions (Addendum)
 Wear CPAP at bedtime at least 6 or more hours each night Try Saline nasal gel At bedtime   Cpap mask liners on Amazon or [email or web address]  Work on healthy weight loss Do not drive if sleepy  Chest xray and labs today  Allegra 180mg  At bedtime  As needed  drainage .  Delsym 2 tsp Twice daily  as needed cough  Saline nasal rinses As needed   Follow-up in 3 months and As needed   Please contact office for sooner follow up if symptoms do not improve or worsen or seek emergency care

## 2023-05-30 NOTE — Assessment & Plan Note (Signed)
 Allergic rhinitis will try Allegra 180 mg daily.  May also try Delsym for cough secondary to postnasal drainage.  Plan  Patient Instructions  Wear CPAP at bedtime at least 6 or more hours each night Try Saline nasal gel At bedtime   Cpap mask liners on Amazon or [email or web address]  Work on healthy weight loss Do not drive if sleepy  Chest xray and labs today  Allegra 180mg  At bedtime  As needed  drainage .  Delsym 2 tsp Twice daily  as needed cough  Saline nasal rinses As needed   Follow-up in 3 months and As needed   Please contact office for sooner follow up if symptoms do not improve or worsen or seek emergency care

## 2023-05-31 ENCOUNTER — Ambulatory Visit (HOSPITAL_COMMUNITY)

## 2023-05-31 ENCOUNTER — Telehealth: Payer: Self-pay | Admitting: *Deleted

## 2023-05-31 LAB — D-DIMER, QUANTITATIVE: D-Dimer, Quant: 0.55 ug{FEU}/mL — ABNORMAL HIGH (ref <0.50)

## 2023-05-31 NOTE — Telephone Encounter (Signed)
 From Roena Clark NP:  d dimer is elevated , can you call her, I ordered her a ct chest , can you check with pcc to see if this can be set up today.  Called and spoke with patient, made aware of results and recommendations.  She verbalized understanding.  Nothing further.  PCCs made aware of stat CTA needed.

## 2023-06-01 ENCOUNTER — Ambulatory Visit (HOSPITAL_COMMUNITY)
Admission: RE | Admit: 2023-06-01 | Discharge: 2023-06-01 | Disposition: A | Discharge status: Home / Self Care | Source: Ambulatory Visit | Attending: Adult Health | Admitting: Adult Health

## 2023-06-01 DIAGNOSIS — R0602 Shortness of breath: Secondary | ICD-10-CM | POA: Diagnosis present

## 2023-06-01 MED ORDER — IOHEXOL 350 MG/ML SOLN
75.0000 mL | Freq: Once | INTRAVENOUS | Status: AC | PRN
  Administered 2023-06-01: 75 mL via INTRAVENOUS

## 2023-06-02 NOTE — Progress Notes (Signed)
 Duplicate, closing result note.  Already addressed.

## 2023-06-30 ENCOUNTER — Ambulatory Visit: Admitting: Adult Health

## 2023-08-30 ENCOUNTER — Ambulatory Visit: Admitting: Adult Health

## 2023-09-22 ENCOUNTER — Ambulatory Visit (INDEPENDENT_AMBULATORY_CARE_PROVIDER_SITE_OTHER): Admitting: Family Medicine

## 2023-09-22 ENCOUNTER — Encounter (HOSPITAL_BASED_OUTPATIENT_CLINIC_OR_DEPARTMENT_OTHER): Payer: Self-pay | Admitting: Family Medicine

## 2023-09-22 VITALS — BP 128/68 | HR 104 | Ht 62.5 in | Wt 328.2 lb

## 2023-09-22 DIAGNOSIS — Z6841 Body Mass Index (BMI) 40.0 and over, adult: Secondary | ICD-10-CM

## 2023-09-22 DIAGNOSIS — G4733 Obstructive sleep apnea (adult) (pediatric): Secondary | ICD-10-CM

## 2023-09-22 DIAGNOSIS — J40 Bronchitis, not specified as acute or chronic: Secondary | ICD-10-CM

## 2023-09-22 MED ORDER — PREDNISONE 10 MG (21) PO TBPK: ORAL_TABLET | ORAL | 0 refills | Status: DC

## 2023-09-22 MED ORDER — AZITHROMYCIN 250 MG PO TABS: ORAL_TABLET | ORAL | 0 refills | Status: AC

## 2023-09-22 MED ORDER — ALBUTEROL SULFATE HFA 108 (90 BASE) MCG/ACT IN AERS: 2.0000 | INHALATION_SPRAY | Freq: Four times a day (QID) | RESPIRATORY_TRACT | 2 refills | Status: AC | PRN

## 2023-09-22 NOTE — Progress Notes (Signed)
 New Patient Office Visit  Subjective:   Anna Montgomery [birth year] 09/22/2023  Chief Complaint  Patient presents with   New Patient (Initial Visit)    Patient is here today to get established with the practice. States she recently had a double ear infection and feels like her right ear is getting clogged up. Also has had a cough with phlegm for about a month. Wants to discuss weight loss.    Discussed the use of AI scribe software for clinical note transcription with the patient, who gave verbal consent to proceed.  History of Present Illness  HPI: Anna Montgomery presents today to establish care at [facility] at [facility]. Introduced to Publishing rights manager role and practice setting.  All questions answered.   Last PCP: Dr. Knowlton  Last annual physical: Unknown    Anna Montgomery is a 28 year old female who presents with concerns about an ear infection and persistent cough.  She describes a sensation of fullness in her right ear, which she associates with the onset of an ear infection. She has a history of recurrent ear infections, including a double ear infection a month ago, and watery ear drainage since childhood. .She has been experiencing a persistent cough for about a month, which she describes as congested but non-productive. The cough is more chest-centered. She has been using over-the-counter medications like DayQuil and Robitussin to manage symptoms. No fever, significant congestion, or runny nose.  She is breastfeeding, although she notes a low milk supply and primarily uses it for comforting her child. No history of asthma, tobacco use, or vaping. She denies sinus pressure, sore throat, or headaches at present.   OSA:  She is concerned about weight management, particularly in relation to her sleep apnea. She has a history of OSA and is currently trying to drink more water and increase her physical activity, including walking and  physical therapy for an ACL tear. She is currently managed by Madelin Stank, NP with Pulmonology for OSA and was recommended to discuss GLP 1 therapy for assistance with weight loss for OSA.    Wt Readings from Last 3 Encounters:  09/22/23 (!) 328 lb 3.2 oz (148.9 kg)  05/30/23 (!) 325 lb 12.8 oz (147.8 kg)  02/22/23 (!) 326 lb 3.2 oz (148 kg)     The following portions of the patient's history were reviewed and updated as appropriate: past medical history, past surgical history, family history, social history, allergies, medications, and problem list.   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   ADHD (attention deficit hyperactivity disorder), combined type 01/20/2011   Oppositional defiant disorder 01/20/2011   Past Medical History:  Diagnosis Date   ADHD (attention deficit hyperactivity disorder)    Ankle edema 05/30/2023   Anxiety    Depression    Encounter to determine fetal viability of pregnancy 06/19/2018   Headache(784.0)    Hypertension    Miscarriage    Oppositional defiant disorder    Positive pregnancy test 06/19/2018   SAB (  spontaneous abortion) 07/27/2018   Past Surgical History:  Procedure Laterality Date   CESAREAN SECTION     Family History  Problem Relation Age of Onset   Bipolar disorder Mother    Anxiety disorder Mother    Arthritis Mother    Drug abuse Mother    Physical abuse Mother    Depression Maternal Aunt    Depression Maternal Grandmother    Heart disease  Maternal Grandmother    Cancer Maternal Grandmother    Arthritis Other    Cancer Other    Asthma Other    Diabetes Other    Kidney disease Other    Heart disease Maternal Grandfather    Alcohol abuse Maternal Grandfather        MGGF   Dementia Maternal Grandfather    OCD Maternal Grandfather        hording   ADD / ADHD Cousin    Paranoid behavior Neg Hx    Schizophrenia Neg Hx    Seizures Neg Hx    Sexual abuse Neg Hx    Social History   Socioeconomic History   Marital status: Single    Spouse name: Not on file   Number of children: Not on file   Years of education: Not on file   Highest education level: Not on file  Occupational History   Not on file  Tobacco Use   Smoking status: Never   Smokeless tobacco: Never  Vaping Use   Vaping status: Never Used  Substance and Sexual Activity   Alcohol use: No   Drug use: No   Sexual activity: Not Currently    Birth control/protection: Condom  Other Topics Concern   Not on file  Social History Narrative   Not on file   Social Drivers of Health   Financial Resource Strain: Not on file  Food Insecurity: No Food Insecurity (02/14/2020)   Received from [facility]   Hunger Vital Sign    Within the past 12 months, you worried that your food would run out before you got the money to buy more.: Never true    Within the past 12 months, the food you bought just didn't last and you didn't have money to get more.: Never true  Transportation Needs: No Transportation Needs (02/14/2020)   Received from [facility]   PRAPARE - Transportation    Lack of Transportation (Medical): No    Lack of Transportation (Non-Medical): No  Physical Activity: Inactive (01/19/2023)   Received from [facility]   Exercise Vital Sign    On average, how many days per week do you engage in moderate to strenuous exercise (like a brisk walk)?: 0 days    On average, how many minutes do you engage in exercise at this level?: 0 min  Stress: Not on  file  Social Connections: Not on file  Intimate Partner Violence: Not At Risk (01/19/2023)   Received from [facility]   Humiliation, Afraid, Rape, and Kick questionnaire    Within the last year, have you been afraid of your partner or ex-partner?: No    Within the last year, have you been humiliated or emotionally abused in other ways by your partner or ex-partner?: No    Within the last year, have you been kicked, hit, slapped, or otherwise physically hurt by your partner or ex-partner?: No    Within the last year, have you been raped or forced to have any kind of sexual activity by your  partner or ex-partner?: No   Outpatient Medications Prior to Visit  Medication Sig Dispense Refill   Alum Hydroxide-Mag Carbonate (GAVISCON EXTRA RELIEF FORMULA) 508-475 MG/10ML SUSP Take 10-15 mLs by mouth 3 (three) times daily between meals.     esomeprazole (NEXIUM) 40 MG capsule Take 1 capsule (40 mg total) by mouth 2 (two) times daily before a meal. 60 capsule 5   ibuprofen (ADVIL) 800 MG tablet Take 800 mg by mouth 3 (three) times daily as needed for pain.     metoprolol tartrate (LOPRESSOR) 25 MG tablet Take 25 mg by mouth.     docusate sodium (COLACE) 100 MG capsule Take 100 mg by mouth 2 (two) times daily.     No facility-administered medications prior to visit.   No Known Allergies  ROS: A complete ROS was performed with pertinent positives/negatives noted in the HPI. The remainder of the ROS are negative.   Objective:   Today's Vitals   09/22/23 1434  BP: 128/68  Pulse: (!) 104  SpO2: 99%  Weight: (!) 328 lb 3.2 oz (148.9 kg)  Height: 5' 2.5 (1.588 m)    GENERAL: Well-appearing, in NAD. Obese.  SKIN: Pink, warm and dry.  Head: Normocephalic. NECK: Trachea midline. Full ROM w/o pain or tenderness. No lymphadenopathy.  EARS: Tympanic membranes are intact, slightly injected bilaterally without bulging and without drainage. Appropriate landmarks visualized.  EYES: Conjunctiva  clear without exudates. EOMI, PERRL, no drainage present.  NOSE: Septum midline w/o deformity. Nares patent, mucosa pink and non-inflamed w/o drainage. No sinus tenderness.  THROAT: Uvula midline. Oropharynx clear. Tonsils non-inflamed without exudate. Mucous membranes pink and moist.  RESPIRATORY: Chest wall symmetrical. Respirations even and non-labored. Breath sounds with mild wheezing to left lower base. Cough is congested, frequent, but non productive.  CARDIAC: S1, S2 present, regular rate and rhythm without murmur or gallops. Peripheral pulses 2+ bilaterally.  MSK: Muscle tone and strength appropriate for age.  NEUROLOGIC: No motor or sensory deficits. Steady, even gait. C2-C12 intact.  PSYCH/MENTAL STATUS: Alert, oriented x 3. Cooperative, appropriate mood and affect.      Assessment & Plan:  1. OSA (obstructive sleep apnea) (Primary) Managed currently by Pulmonology. Will obtain labs for discussion of possible GLP 1 therapy to assist in weight loss. No family hx of thyroid cancer, pancreatitis, MEN2.   2. Bronchitis Start azithromycin, prednisone taper and use albuterol inhaler as needed. Recommend using plain mucinex for congestion. If no improvement in 2 weeks, reach out to PCP.   3. Morbid obesity with BMI of 50.0-59.9, adult (HCC) Will obtain fasting labs at [facility] to rule out possible co morbidities contributing to weight gain. Discussed healthy diet and regular exercise. She may be a good candidate for MWM.  - Comprehensive metabolic panel with GFR - Lipid panel - TSH - Hemoglobin A1c   Patient to reach out to office if new, worrisome, or unresolved symptoms arise or if no improvement in patient's condition. Patient verbalized understanding and is agreeable to treatment plan. All questions answered to patient's satisfaction.    Return in about 3 months (around 12/23/2023) for ANNUAL PHYSICAL, Weight Check up .    Thersia Schuyler Stark, [place]

## 2023-09-26 ENCOUNTER — Encounter (HOSPITAL_BASED_OUTPATIENT_CLINIC_OR_DEPARTMENT_OTHER): Payer: Self-pay | Admitting: Family Medicine

## 2023-10-01 LAB — LIPID PANEL
Chol/HDL Ratio: 4.3 ratio (ref 0.0–4.4)
Cholesterol, Total: 179 mg/dL (ref 100–199)
HDL: 42 mg/dL (ref >39)
LDL Chol Calc (NIH): 113 mg/dL — ABNORMAL HIGH (ref 0–99)
Triglycerides: 134 mg/dL (ref 0–149)
VLDL Cholesterol Cal: 24 mg/dL (ref 5–40)

## 2023-10-01 LAB — COMPREHENSIVE METABOLIC PANEL WITH GFR
ALT: 12 IU/L (ref 0–32)
AST: 10 IU/L (ref 0–40)
Albumin: 4 g/dL (ref 4.0–5.0)
Alkaline Phosphatase: 122 IU/L — ABNORMAL HIGH (ref 44–121)
BUN/Creatinine Ratio: 19 (ref 9–23)
BUN: 13 mg/dL (ref 6–20)
Bilirubin Total: 0.4 mg/dL (ref 0.0–1.2)
CO2: 20 mmol/L (ref 20–29)
Calcium: 9.7 mg/dL (ref 8.7–10.2)
Chloride: 99 mmol/L (ref 96–106)
Creatinine, Ser: 0.67 mg/dL (ref 0.57–1.00)
Globulin, Total: 3.1 g/dL (ref 1.5–4.5)
Glucose: 169 mg/dL — ABNORMAL HIGH (ref 70–99)
Potassium: 4.4 mmol/L (ref 3.5–5.2)
Sodium: 137 mmol/L (ref 134–144)
Total Protein: 7.1 g/dL (ref 6.0–8.5)
eGFR: 124 mL/min/1.73 (ref >59)

## 2023-10-01 LAB — TSH: TSH: 3.19 u[IU]/mL (ref 0.450–4.500)

## 2023-10-01 LAB — HEMOGLOBIN A1C
Est. average glucose Bld gHb Est-mCnc: 174 mg/dL
Hgb A1c MFr Bld: 7.7 % — ABNORMAL HIGH (ref 4.8–5.6)

## 2023-10-04 ENCOUNTER — Ambulatory Visit (HOSPITAL_BASED_OUTPATIENT_CLINIC_OR_DEPARTMENT_OTHER): Payer: Self-pay | Admitting: Family Medicine

## 2023-10-04 DIAGNOSIS — E119 Type 2 diabetes mellitus without complications: Secondary | ICD-10-CM | POA: Insufficient documentation

## 2023-10-04 NOTE — Progress Notes (Signed)
 Hi Anna Montgomery, Your kidney function and electrolytes are stable.  Your glucose was elevated and your A1c does show that you are actually in the type II diabetic category.  I know that we discussed possible GLP-1 therapy such as Wegovy at your last visit, but with being diabetic, I believe that you would have better approval for GLP-1 such as Ozempic or Mounjaro.  These are the same medications as Zepbound or Wegovy, but indicated for type 2 diabetes.  We are only able to start these if you are no longer breast-feeding as they are not safe to take if you are currently breast-feeding.  Your cholesterol value was slightly elevated, but overall well-controlled.  Your thyroid hormone was normal.  If you are interested in starting Mounjaro or Ozempic, please let me know and we will get this started for you.  We will plan to see you back in November for your appointment and for A1c recheck.  At that time we will also do a foot exam and urine to check for protein due to diabetes status.  Please make dietary changes such as reducing carbohydrates, regular exercise to help control the progression of type 2 diabetes.

## 2023-10-11 ENCOUNTER — Other Ambulatory Visit (HOSPITAL_BASED_OUTPATIENT_CLINIC_OR_DEPARTMENT_OTHER): Payer: Self-pay | Admitting: Family Medicine

## 2023-10-11 DIAGNOSIS — E119 Type 2 diabetes mellitus without complications: Secondary | ICD-10-CM

## 2023-10-11 MED ORDER — TIRZEPATIDE 2.5 MG/0.5ML ~~LOC~~ SOAJ: 2.5000 mg | SUBCUTANEOUS | 1 refills | Status: DC

## 2023-10-14 ENCOUNTER — Other Ambulatory Visit (HOSPITAL_BASED_OUTPATIENT_CLINIC_OR_DEPARTMENT_OTHER): Payer: Self-pay | Admitting: Family Medicine

## 2023-10-14 ENCOUNTER — Ambulatory Visit (INDEPENDENT_AMBULATORY_CARE_PROVIDER_SITE_OTHER): Admitting: Adult Health

## 2023-10-14 ENCOUNTER — Encounter: Payer: Self-pay | Admitting: Adult Health

## 2023-10-14 VITALS — BP 110/70 | HR 92 | Ht 62.5 in | Wt 327.6 lb

## 2023-10-14 DIAGNOSIS — G4733 Obstructive sleep apnea (adult) (pediatric): Secondary | ICD-10-CM | POA: Diagnosis not present

## 2023-10-14 DIAGNOSIS — Z6841 Body Mass Index (BMI) 40.0 and over, adult: Secondary | ICD-10-CM

## 2023-10-14 MED ORDER — METFORMIN HCL ER 500 MG PO TB24
ORAL_TABLET | ORAL | 2 refills | Status: DC
Start: 2023-10-14 — End: 2023-12-27

## 2023-10-14 NOTE — Progress Notes (Signed)
 @Patient  ID: Anna Montgomery, female    DOB: Jun 01, 1995, 28 y.o.   MRN: [id number]  Chief Complaint  Patient presents with   Sleep Apnea    Referring provider: Nemiah Kemps, MD  HPI: 28 yo female seen for sleep consult January 2025 after episode of SVT found to have moderate OSA  TEST/EVENTS :  home sleep study done on March 18, 2023 that showed moderate obstructive sleep apnea with AHI at 19/hour and SpO2 low at 80%.   10/14/2023 Follow up: OSA Discussed the use of AI scribe software for clinical note transcription with the patient, who gave verbal consent to proceed.  History of Present Illness Anna Montgomery is a 28 year old female with sleep apnea who presents for a sleep consult following an episode of supraventricular tachycardia.  She has moderate sleep apnea, diagnosed via a home sleep study in February 2025, which showed an apnea-hypopnea index (AHI) of 19 events per hour and a lowest oxygen saturation of 80%. She was started on CPAP therapy five months ago. CPAP is effective when used, but she has struggled with compliance, especially during a recent illness where she experienced difficulty breathing through the nasal mask. She is scheduled for a new mask fitting to improve comfort and compliance.  She reports a history of snoring and daytime sleepiness. Since starting CPAP therapy, she notes improvement in her symptoms when she is able to use the device consistently. However, she has faced challenges with mask fit and nasal irritation, which have affected her ability to use the CPAP regularly.  CPAP download shows 33% compliance with daily average usage at 8 hours she is on AutoSet 5 to 15 cm H2O.  AHI 0.9/hour.   She has experienced mild intermittent shortness of breath and lower extremity edema in the past, but recent CT imaging with PE protocol was negative for pulmonary embolism, and her lungs were clear. Her current weight is 328 pounds with a BMI of 59.  Recently, she  was diagnosed with type 2 diabetes. She has not started any medication due to insurance complications, which require her to try metformin  for 90 days before other medications can be approved. Her A1c was noted to be 7.7%.   She has been on metoprolol 25 mg daily for heart palpitations and has been advised to take an additional 25 mg as needed. She experiences palpitations, especially after physical activity, and wonders if these are related to her inconsistent CPAP use.  She has a three-year-old daughter who attends early education, and she has been sick recently, which she attributes to her daughter's exposure to illnesses at school. She is currently undergoing physical therapy twice a week for knee issues.     No Known Allergies  Immunization History  Administered Date(s) Administered   Tdap 12/05/2019    Past Medical History:  Diagnosis Date   ADHD (attention deficit hyperactivity disorder)    Ankle edema 05/30/2023   Anxiety    Depression    Encounter to determine fetal viability of pregnancy 06/19/2018   Headache(784.0)    Hypertension    Miscarriage    Oppositional defiant disorder    Positive pregnancy test 06/19/2018   SAB (spontaneous abortion) 07/27/2018    Tobacco History: Social History   Tobacco Use  Smoking Status Never  Smokeless Tobacco Never   Counseling given: Not Answered   Outpatient Medications Prior to Visit  Medication Sig Dispense Refill   albuterol  (VENTOLIN  HFA) 108 (90 Base) MCG/ACT inhaler Inhale 2 puffs into  the lungs every 6 (six) hours as needed for wheezing or shortness of breath. 8 g 2   Alum Hydroxide-Mag Carbonate (GAVISCON EXTRA RELIEF FORMULA) 508-475 MG/10ML SUSP Take 10-15 mLs by mouth 3 (three) times daily between meals. (Patient taking differently: Take 10-15 mLs by mouth 3 (three) times daily as needed.)     esomeprazole  (NEXIUM ) 40 MG capsule Take 1 capsule (40 mg total) by mouth 2 (two) times daily before a meal. 60 capsule 5    ibuprofen  (ADVIL ) 800 MG tablet Take 800 mg by mouth 3 (three) times daily as needed for pain.     metoprolol tartrate (LOPRESSOR) 25 MG tablet Take 25 mg by mouth.     predniSONE  (STERAPRED UNI-PAK 21 TAB) 10 MG (21) TBPK tablet Use as directed. (Patient not taking: Reported on 10/14/2023) 21 each 0   tirzepatide  (MOUNJARO ) 2.5 MG/0.5ML Pen Inject 2.5 mg into the skin once a week. (Patient not taking: Reported on 10/14/2023) 2 mL 1   No facility-administered medications prior to visit.     Review of Systems:   Constitutional:   No  weight loss, night sweats,  Fevers, chills,+ fatigue, or  lassitude.  HEENT:   No headaches,  Difficulty swallowing,  Tooth/dental problems, or  Sore throat,                No sneezing, itching, ear ache, nasal congestion, post nasal drip,   CV:  No chest pain,  Orthopnea, PND, swelling in lower extremities, anasarca, dizziness, palpitations, syncope.   GI  No heartburn, indigestion, abdominal pain, nausea, vomiting, diarrhea, change in bowel habits, loss of appetite, bloody stools.   Resp:   No excess mucus, no productive cough,  No non-productive cough,  No coughing up of blood.  No change in color of mucus.  No wheezing.  No chest wall deformity  Skin: no rash or lesions.  GU: no dysuria, change in color of urine, no urgency or frequency.  No flank pain, no hematuria   MS:  No joint pain or swelling.  No decreased range of motion.  No back pain.    Physical Exam  BP 110/70   Pulse 92   Ht 5' 2.5 (1.588 m)   Wt (!) 327 lb 9.6 oz (148.6 kg)   LMP 09/09/2023 (Exact Date)   SpO2 98% Comment: RA  BMI 58.96 kg/m   GEN: A/Ox3; pleasant , NAD, well nourished    HEENT:  [place]/AT,  NOSE-clear, THROAT-clear, no lesions, no postnasal drip or exudate noted.   NECK:  Supple w/ fair ROM; no JVD; normal carotid impulses w/o bruits; no thyromegaly or nodules palpated; no lymphadenopathy.    RESP  Clear  P & A; w/o, wheezes/ rales/ or rhonchi. no accessory  muscle use, no dullness to percussion  CARD:  RRR, no m/r/g, no peripheral edema, pulses intact, no cyanosis or clubbing.  GI:   Soft & nt; nml bowel sounds; no organomegaly or masses detected.   Musco: Warm bil, no deformities or joint swelling noted.   Neuro: alert, no focal deficits noted.    Skin: Warm, no lesions or rashes    Lab Results:    Imaging: No results found.  Administration History     None           No data to display          No results found for: NITRICOXIDE      Assessment & Plan:   No problem-specific Assessment & Plan notes  found for this encounter.  Assessment and Plan Assessment & Plan Obstructive sleep apnea, moderate, on CPAP therapy   Moderate obstructive sleep apnea with an AHI of 19/hour and low SpO2 at 80%. CPAP therapy has been initiated, but compliance is an issue due to discomfort and recent illness. Consistent CPAP use significantly improves symptoms. The current mask may cause nasal irritation and discomfort, reducing compliance. Consistent use is crucial to prevent long-term complications Encourage CPAP use for at least 4 hours per night. Recommend mask fitting for a more comfortable option and suggest mask liners and saline gel to alleviate nasal irritation. Advise keeping CPAP equipment clean and using distilled water in the chamber.  Morbid obesity due to excess calories   Morbid obesity with a BMI of 58.9 requires weight management for overall health improvement, including management of sleep apnea and diabetes. Current weight-related health issues include type 2 diabetes and potential exacerbation of sleep apnea. She is engaged in physical therapy for knee issues, which may aid in weight management. Discussed potential use of tirzepatide  for weight loss with moderate OSA and DM management , which may also benefit sleep apnea, pending insurance approval with PCP. Encourage continued physical therapy and increased physical  activity. Discuss potential benefits of weight loss medications like tirzepatide  for sleep apnea and weight management, pending insurance approval. Advise on good food choices and calorie management.  Plan  Patient Instructions  Wear CPAP at bedtime at least 6 or more hours each night Saline nasal gel At bedtime   Cpap mask liners on Amazon or [email or web address]  Work on healthy weight loss Do not drive if sleepy Follow up in 4-6 months and As needed          [employer], NP 10/14/2023

## 2023-10-14 NOTE — Patient Instructions (Addendum)
 Wear CPAP at bedtime at least 6 or more hours each night Saline nasal gel At bedtime   Cpap mask liners on Amazon or [email or web address]  Work on healthy weight loss Do not drive if sleepy Follow up in 4-6 months and As needed

## 2023-10-25 ENCOUNTER — Other Ambulatory Visit (HOSPITAL_BASED_OUTPATIENT_CLINIC_OR_DEPARTMENT_OTHER): Payer: Self-pay | Admitting: Family Medicine

## 2023-10-25 DIAGNOSIS — E119 Type 2 diabetes mellitus without complications: Secondary | ICD-10-CM

## 2023-10-25 MED ORDER — TIRZEPATIDE 2.5 MG/0.5ML ~~LOC~~ SOAJ: 2.5000 mg | SUBCUTANEOUS | 1 refills | Status: DC

## 2023-10-31 ENCOUNTER — Other Ambulatory Visit (HOSPITAL_BASED_OUTPATIENT_CLINIC_OR_DEPARTMENT_OTHER): Payer: Self-pay | Admitting: Family Medicine

## 2023-10-31 MED ORDER — OZEMPIC (0.25 OR 0.5 MG/DOSE) 2 MG/3ML ~~LOC~~ SOPN: PEN_INJECTOR | SUBCUTANEOUS | 2 refills | Status: DC

## 2023-12-27 ENCOUNTER — Ambulatory Visit (INDEPENDENT_AMBULATORY_CARE_PROVIDER_SITE_OTHER): Admitting: Family Medicine

## 2023-12-27 ENCOUNTER — Encounter (HOSPITAL_BASED_OUTPATIENT_CLINIC_OR_DEPARTMENT_OTHER): Payer: Self-pay | Admitting: Family Medicine

## 2023-12-27 VITALS — BP 119/80 | HR 89 | Ht 62.5 in | Wt 319.0 lb

## 2023-12-27 DIAGNOSIS — E119 Type 2 diabetes mellitus without complications: Secondary | ICD-10-CM

## 2023-12-27 DIAGNOSIS — Z23 Encounter for immunization: Secondary | ICD-10-CM

## 2023-12-27 DIAGNOSIS — R1012 Left upper quadrant pain: Secondary | ICD-10-CM | POA: Diagnosis not present

## 2023-12-27 DIAGNOSIS — Z0000 Encounter for general adult medical examination without abnormal findings: Secondary | ICD-10-CM | POA: Diagnosis not present

## 2023-12-27 DIAGNOSIS — Z6841 Body Mass Index (BMI) 40.0 and over, adult: Secondary | ICD-10-CM

## 2023-12-27 MED ORDER — SEMAGLUTIDE (1 MG/DOSE) 4 MG/3ML ~~LOC~~ SOPN: 1.0000 mg | PEN_INJECTOR | SUBCUTANEOUS | 3 refills | Status: DC

## 2023-12-27 NOTE — Progress Notes (Addendum)
 Subjective:   Anna Montgomery 1995/03/24  12/27/2023   CC: Chief Complaint  Patient presents with   Annual Exam    Pt is here today for her physical. States her orthopedic provider wanted to ask PCP about medrol dosepak prior to having it prescribed to help with pain.    HPI: Anna Montgomery is a 28 y.o. female who presents for a routine health maintenance exam.  Labs collected at time of visit.    HEALTH SCREENINGS: - Vision Screening: [facility]- [place]  - Dental Visits: Recommended - Pap smear: up to date - Breast Exam: Declined - STD Screening: Declined - Mammogram (40+): Not applicable  - Colonoscopy (45+): Not applicable  - Bone Density (65+ or under 65 with predisposing conditions): Not applicable  - Lung CA screening with low-dose CT:  Not applicable Adults age 75-80 who are current cigarette smokers or quit within the last 15 years. Must have 20 pack year history.   Depression and Anxiety Screen done today and results listed below:     12/27/2023    2:05 PM 09/22/2023    2:45 PM  Depression screen PHQ 2/9  Decreased Interest 0 0  Down, Depressed, Hopeless 0 0  PHQ - 2 Score 0 0  Altered sleeping 0 0  Tired, decreased energy 0 0  Change in appetite 0 0  Feeling bad or failure about yourself  0 0  Trouble concentrating 0 0  Moving slowly or fidgety/restless 0 0  Suicidal thoughts 0 0  PHQ-9 Score 0 0   Difficult doing work/chores Not difficult at all Not difficult at all     Data saved with a previous flowsheet row definition      12/27/2023    2:06 PM 09/22/2023    2:46 PM  GAD 7 : Generalized Anxiety Score  Nervous, Anxious, on Edge 1 3  Control/stop worrying 1 1  Worry too much - different things 1 0  Trouble relaxing 3 3  Restless 1 2  Easily annoyed or irritable 3 3  Afraid - awful might happen 1 1  Total GAD 7 Score 11 13  Anxiety Difficulty Somewhat difficult Somewhat difficult    IMMUNIZATIONS: - Tdap: Tetanus vaccination status reviewed:  last tetanus booster within 10 years. - HPV: Up to date - Influenza: Administered today - Pneumovax: Not applicable - Prevnar 20: Not applicable - Shingrix (50+): Not applicable   Past medical history, surgical history, medications, allergies, family history and social history reviewed with patient today and changes made to appropriate areas of the chart.   Past Medical History:  Diagnosis Date   ADHD (attention deficit hyperactivity disorder)    Ankle edema 05/30/2023   Anxiety    Depression    Encounter to determine fetal viability of pregnancy 06/19/2018   Headache(784.0)    Hypertension    Miscarriage    Oppositional defiant disorder    Positive pregnancy test 06/19/2018   SAB (spontaneous abortion) 07/27/2018    Past Surgical History:  Procedure Laterality Date   CESAREAN SECTION      Current Outpatient Medications on File Prior to Visit  Medication Sig   albuterol  (VENTOLIN  HFA) 108 (90 Base) MCG/ACT inhaler Inhale 2 puffs into the lungs every 6 (six) hours as needed for wheezing or shortness of breath.   Alum Hydroxide-Mag Carbonate (GAVISCON EXTRA RELIEF FORMULA) 508-475 MG/10ML SUSP Take 10-15 mLs by mouth 3 (three) times daily between meals. (Patient taking differently: Take 10-15 mLs by mouth as needed.)  Biotin 89999 MCG TABS Take by mouth.   esomeprazole  (NEXIUM ) 40 MG capsule Take 1 capsule (40 mg total) by mouth 2 (two) times daily before a meal.   ibuprofen  (ADVIL ) 800 MG tablet Take 800 mg by mouth 3 (three) times daily as needed for pain.   metoprolol tartrate (LOPRESSOR) 25 MG tablet Take 25 mg by mouth.   Multiple Vitamins-Minerals (ONE-A-DAY WOMENS) tablet Take 1 tablet by mouth daily.   naproxen  (NAPROSYN ) 500 MG tablet Take 500 mg by mouth 2 (two) times daily with a meal.   No current facility-administered medications on file prior to visit.    No Known Allergies   Social History   Socioeconomic History   Marital status: Single    Spouse  name: Not on file   Number of children: Not on file   Years of education: Not on file   Highest education level: Not on file  Occupational History   Not on file  Tobacco Use   Smoking status: Never   Smokeless tobacco: Never  Vaping Use   Vaping status: Never Used  Substance and Sexual Activity   Alcohol use: No   Drug use: No   Sexual activity: Not Currently    Birth control/protection: Condom  Other Topics Concern   Not on file  Social History Narrative   Not on file   Social Drivers of Health   Financial Resource Strain: Not on file  Food Insecurity: No Food Insecurity (02/14/2020)   Received from [facility]   Hunger Vital Sign    Within the past 12 months, you worried that your food would run out before you got the money to buy more.: Never true    Within the past 12 months, the food you bought just didn't last and you didn't have money to get more.: Never true  Transportation Needs: No Transportation Needs (02/14/2020)   Received from [facility]   PRAPARE - Transportation    Lack of Transportation (Medical): No    Lack of Transportation (Non-Medical): No  Physical Activity: Inactive (01/19/2023)   [facility] from [facility]   Exercise Vital Sign    On average, how many days per week do you engage in moderate to strenuous exercise (like a brisk walk)?: 0 days    On average, how many minutes do you engage in exercise at this level?: 0 min  Stress: Not on file  Social Connections: Not on file  Intimate Partner Violence: Not At Risk (01/19/2023)   Received from [facility]   Humiliation, Afraid, Rape, and Kick questionnaire    Within the last year, have you been afraid of your partner or ex-partner?: No    Within the last year, have you been humiliated or emotionally abused in other ways by your partner or ex-partner?: No    Within the last year, have you been kicked, hit, slapped, or otherwise physically hurt by your partner or ex-partner?: No    Within  the last year, have you been raped or forced to have any kind of sexual activity by your partner or ex-partner?: No   Social History   Tobacco Use  Smoking Status Never  Smokeless Tobacco Never   Social History   Substance and Sexual Activity  Alcohol Use No    Family History  Problem Relation Age of Onset   Bipolar disorder Mother    Anxiety disorder Mother    Arthritis Mother    Drug abuse Mother  Physical abuse Mother    Depression Maternal Aunt    Depression Maternal Grandmother    Heart disease Maternal Grandmother    Cancer Maternal Grandmother    Arthritis Other    Cancer Other    Asthma Other    Diabetes Other    Kidney disease Other    Heart disease Maternal Grandfather    Alcohol abuse Maternal Grandfather        MGGF   Dementia Maternal Grandfather    OCD Maternal Grandfather        hording   ADD / ADHD Cousin    Paranoid behavior Neg Hx    Schizophrenia Neg Hx    Seizures Neg Hx    Sexual abuse Neg Hx      ROS: Denies fever, fatigue, unexplained weight loss/gain, chest pain, SHOB, and palpitations. Denies neurological deficits, gastrointestinal or genitourinary complaints, and skin changes.   Objective:   Today's Vitals   12/27/23 1358  BP: 119/80  Pulse: 89  SpO2: 97%  Weight: (!) 319 lb (144.7 kg)  Height: 5' 2.5 (1.588 m)    GENERAL APPEARANCE: Well-appearing, in NAD. Well nourished.  SKIN: Pink, warm and dry. Turgor normal. No rash, lesion, ulceration, or ecchymoses. Hair evenly distributed.  HEENT: HEAD: Normocephalic.  EYES: PERRLA. EOMI. Lids intact w/o defect. Sclera white, Conjunctiva pink w/o exudate.  EARS: External ear w/o redness, swelling, masses or lesions. EAC clear. TM's intact, translucent w/o bulging, appropriate landmarks visualized. Appropriate acuity to conversational tones.  NOSE: Septum midline w/o deformity. Nares patent, mucosa pink and non-inflamed w/o drainage. No sinus tenderness.  THROAT: Uvula midline.  Oropharynx clear. Tonsils hypertrophic. Oral mucosa pink and moist.  NECK: Supple, Trachea midline. Full ROM w/o pain or tenderness. No lymphadenopathy. Thyroid non-tender w/o enlargement or palpable masses.  RESPIRATORY: Chest wall symmetrical w/o masses. Respirations even and non-labored. Breath sounds clear to auscultation bilaterally. No wheezes, rales, rhonchi, or crackles. CARDIAC: S1, S2 present, regular rate and rhythm. No gallops, murmurs, rubs, or clicks. PMI w/o lifts, heaves, or thrills. No carotid bruits. Capillary refill <2 seconds. Peripheral pulses 2+ bilaterally. GI: Abdomen soft w/o distention. Normoactive bowel sounds. Small soft tissue mass present to LUQ with palpation. Mass is mobile, mildly tender to palpation. No guarding or rebound tenderness. Liver and spleen w/o tenderness or enlargement. No CVA tenderness.   MSK: Muscle tone and strength appropriate for age, w/o atrophy or abnormal movement.  EXTREMITIES: Active ROM intact, w/o tenderness, crepitus, or contracture. No obvious joint deformities or effusions. No clubbing, edema, or cyanosis.  NEUROLOGIC: CN's II-XII intact. Motor strength symmetrical with no obvious weakness. No sensory deficits. DTR's 2+ symmetric bilaterally. Steady, even gait.  PSYCH/MENTAL STATUS: Alert, oriented x 3. Cooperative, appropriate mood and affect.   Diabetic Foot Exam - Simple   Simple Foot Form Diabetic Foot exam was performed with the following findings: Yes 12/27/2023  4:13 PM  Visual Inspection No deformities, no ulcerations, no other skin breakdown bilaterally: Yes Sensation Testing Intact to touch and monofilament testing bilaterally: Yes Pulse Check Posterior Tibialis and Dorsalis pulse intact bilaterally: Yes Comments      Results for orders placed or performed in visit on 09/22/23  Comprehensive metabolic panel with GFR   Collection Time: 09/30/23 10:37 AM  Result Value Ref Range   Glucose 169 (H) 70 - 99 mg/dL   BUN 13  6 - 20 mg/dL   Creatinine, Ser 9.32 0.57 - 1.00 mg/dL   eGFR 875 >40 fO/fpw/8.26   BUN/Creatinine  Ratio 19 9 - 23   Sodium 137 134 - 144 mmol/L   Potassium 4.4 3.5 - 5.2 mmol/L   Chloride 99 96 - 106 mmol/L   CO2 20 20 - 29 mmol/L   Calcium 9.7 8.7 - 10.2 mg/dL   Total Protein 7.1 6.0 - 8.5 g/dL   Albumin 4.0 4.0 - 5.0 g/dL   Globulin, Total 3.1 1.5 - 4.5 g/dL   Bilirubin Total 0.4 0.0 - 1.2 mg/dL   Alkaline Phosphatase 122 (H) 44 - 121 IU/L   AST 10 0 - 40 IU/L   ALT 12 0 - 32 IU/L  Lipid panel   Collection Time: 09/30/23 10:37 AM  Result Value Ref Range   Cholesterol, Total 179 100 - 199 mg/dL   Triglycerides 865 0 - 149 mg/dL   HDL 42 >60 mg/dL   VLDL Cholesterol Cal 24 5 - 40 mg/dL   LDL Chol Calc (NIH) 886 (H) 0 - 99 mg/dL   Chol/HDL Ratio 4.3 0.0 - 4.4 ratio  TSH   Collection Time: 09/30/23 10:37 AM  Result Value Ref Range   TSH 3.190 0.450 - 4.500 uIU/mL  Hemoglobin A1c   Collection Time: 09/30/23 10:37 AM  Result Value Ref Range   Hgb A1c MFr Bld 7.7 (H) 4.8 - 5.6 %   Est. average glucose Bld gHb Est-mCnc 174 mg/dL    Assessment & Plan:  1. Type 2 diabetes mellitus without complication, without long-term current use of insulin (HCC) (Primary) Patient doing well currently on Ozempic  0.5 mg.  Will increase to Ozempic  1 mg for assistance with weight, appetite control, and A1c.  She will obtain A1c and microalbumin labs at [facility] closer to home in [place] next week.  Referral placed to nutrition services per patient request as well for guidance in regards to diabetic nutrition.  Foot exam completed in office today.  Patient stated that she has been breast-feeding her child who is approximately 3-4 yo.  She states that she does not have a milk supply but uses feeding for comfort.  Patient was advised prior to starting GLP-1 not to continue with this due to adverse risk of medication transference and she verbalized understanding at that time.  She has been advised once  again to stop and verbalizes understanding.  If she desires to continue breast-feeding, PCP will stop GLP-1 prescribing.  Patient states she has not been allowing child to breast-feed for the past month. - Hemoglobin A1c - Microalbumin / creatinine urine ratio - Referral to Nutrition and Diabetes Services  2. LUQ pain Patient reported concern of possible lipoma to left upper quadrant that is giving pain and discomfort especially when wearing a bra.  Recommend imaging of the area and possible surgical consultation.  Patient states she was recommended for surgery prior but never proceeded with this. - US  Abdomen Limited; Future  3. Annual physical exam Discussed preventative screenings, vaccines, and healthy lifestyle with patient.    4. Immunization due - Flu vaccine trivalent PF, 6mos and older(Flulaval,Afluria,Fluarix,Fluzone)   5. Morbid Obesity Discussed recent weight loss with Glp 1 and recommend continuing with dietary changes and regular exercise. Nutrition consult will be helpful.    Orders Placed This Encounter  Procedures   US  Abdomen Limited    Standing Status:   Future    Expiration Date:   12/26/2024    Reason for Exam (SYMPTOM  OR DIAGNOSIS REQUIRED):   LUQ palpable soft tissue mass causing LUQ abdominal pain    Preferred imaging  location?:   [facility]   Flu vaccine trivalent PF, 6mos and older(Flulaval,Afluria,Fluarix,Fluzone)   Hemoglobin A1c   Microalbumin / creatinine urine ratio   Referral to Nutrition and Diabetes Services    Referral Priority:   Routine    Referral Type:   Consultation    Referral Reason:   Specialty Services Required    Number of Visits Requested:   1    PATIENT COUNSELING:  - Encouraged a healthy well-balanced diet. Patient may adjust caloric intake to maintain or achieve ideal body weight. May reduce intake of dietary saturated fat and total fat and have adequate dietary potassium and calcium preferably from fresh fruits,  vegetables, and low-fat dairy products.   - Advised to avoid cigarette smoking. - Discussed with the patient that most people either abstain from alcohol or drink within safe limits (<=14/week and <=4 drinks/occasion for males, <=7/weeks and <= 3 drinks/occasion for females) and that the risk for alcohol disorders and other health effects rises proportionally with the number of drinks per week and how often a drinker exceeds daily limits. - Discussed cessation/primary prevention of drug use and availability of treatment for abuse.  - Discussed sexually transmitted diseases, avoidance of unintended pregnancy and contraceptive alternatives.  - Stressed the importance of regular exercise - Injury prevention: Discussed safety belts, safety helmets, smoke detector, smoking near bedding or upholstery.  - Dental health: Discussed importance of regular tooth brushing, flossing, and dental visits.   NEXT PREVENTATIVE PHYSICAL DUE IN 1 YEAR.  Return in about 6 months (around 06/25/2024) for DIABETES CHECK UP.  Patient to reach out to office if new, worrisome, or unresolved symptoms arise or if no improvement in patient's condition. Patient verbalized understanding and is agreeable to treatment plan. All questions answered to patient's satisfaction.    Thersia Schuyler Stark, [place]

## 2023-12-27 NOTE — Patient Instructions (Addendum)
 Flexitol- Over the counter for dry skin on feet  Do not breastfeed.     Affordable Dental Services for Adults   [facility] [address] [place], [place] [address] [phone] Access provides dental services to  uninsured [facility] residents  who are enrolled in the [facility]. Services will  be limited to comprehensive  examinations, extractions, fillings, pain  management and some minor  restorative care. In order to qualify for  services, patients will be referred by  [facility]  [facility]) Safety Net Organizations that  already provide medical care to  residents with incomes between 0% and  200% of the federal poverty level.   Payment Options $40.00 per visit  (CASH ONLY)    [facility] Our Dental Clinic is open Monday through Thursday from 7:30 am - 4:00 pm. The dental clinic is available for adults and children with:  [place] [facility], [place] [facility] Choice, [facility], and Self-Pay. Uninsured patients can be seen on a sliding fee scale based on household income.    [facility] [address] [place], [place] [address] Phone [phone] Fax [phone]  Our clinic is open Monday through Friday 8:00 a.m. until 5:00 p.m, with the exception of Wednesday 10 a.m. to 5 p.m.  Medicaid and other insurance plans are welcome. Payment for services is due when services are rendered and may be made by cash or credit card.  If you have dental insurance, we will assist you with your claim submission

## 2024-01-04 ENCOUNTER — Encounter (HOSPITAL_BASED_OUTPATIENT_CLINIC_OR_DEPARTMENT_OTHER): Payer: Self-pay | Admitting: Family Medicine

## 2024-01-04 LAB — HEMOGLOBIN A1C
Est. average glucose Bld gHb Est-mCnc: 143 mg/dL
Hgb A1c MFr Bld: 6.6 % — ABNORMAL HIGH (ref 4.8–5.6)

## 2024-01-04 LAB — MICROALBUMIN / CREATININE URINE RATIO
Creatinine, Urine: 106.7 mg/dL
Microalb/Creat Ratio: <3 mg/g{creat} (ref 0–29)
Microalbumin, Urine: <3 ug/mL

## 2024-01-08 ENCOUNTER — Ambulatory Visit (HOSPITAL_BASED_OUTPATIENT_CLINIC_OR_DEPARTMENT_OTHER): Payer: Self-pay | Admitting: Family Medicine

## 2024-01-08 NOTE — Progress Notes (Signed)
 Hi Josslyn,  Your A1C has improved and is down from 7.7. to 6.6. Please continue with good dietary changes and nutrition consult for further guidance. Urine albumin is normal.

## 2024-01-26 ENCOUNTER — Encounter: Admitting: Nutrition

## 2024-02-22 ENCOUNTER — Encounter: Admitting: Nutrition

## 2024-02-23 ENCOUNTER — Encounter: Payer: Self-pay | Admitting: Psychology

## 2024-02-23 ENCOUNTER — Ambulatory Visit: Admitting: Psychology

## 2024-02-23 DIAGNOSIS — F330 Major depressive disorder, recurrent, mild: Secondary | ICD-10-CM

## 2024-02-23 DIAGNOSIS — F411 Generalized anxiety disorder: Secondary | ICD-10-CM | POA: Diagnosis not present

## 2024-02-23 DIAGNOSIS — F902 Attention-deficit hyperactivity disorder, combined type: Secondary | ICD-10-CM | POA: Diagnosis not present

## 2024-02-23 NOTE — Progress Notes (Deleted)
   Anna Montgomery, [facility]
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
## 2024-03-03 ENCOUNTER — Encounter (HOSPITAL_BASED_OUTPATIENT_CLINIC_OR_DEPARTMENT_OTHER): Payer: Self-pay | Admitting: Family Medicine

## 2024-03-05 ENCOUNTER — Other Ambulatory Visit (HOSPITAL_BASED_OUTPATIENT_CLINIC_OR_DEPARTMENT_OTHER): Payer: Self-pay | Admitting: Family Medicine

## 2024-03-05 MED ORDER — SEMAGLUTIDE (2 MG/DOSE) 8 MG/3ML ~~LOC~~ SOPN: 2.0000 mg | PEN_INJECTOR | SUBCUTANEOUS | 3 refills | Status: AC

## 2024-03-05 NOTE — Telephone Encounter (Signed)
 Please see mychart message sent by pt and advise.

## 2024-03-07 ENCOUNTER — Telehealth (HOSPITAL_BASED_OUTPATIENT_CLINIC_OR_DEPARTMENT_OTHER): Payer: Self-pay | Admitting: Pharmacy Technician

## 2024-03-07 ENCOUNTER — Other Ambulatory Visit (HOSPITAL_COMMUNITY): Payer: Self-pay

## 2024-03-07 NOTE — Telephone Encounter (Signed)
 Pharmacy Patient Advocate Encounter   Received notification from [employer] CMM KEY that prior authorization for Ozempic  (2 MG/DOSE) 8MG /3ML pen-injectors is required/requested.   Insurance verification completed.   The patient is insured through [facility] MEDICAID.    Per test claim: She is restricted to 1 GLP fill at a time. She has to have used 75% of the previous fill on 02/24/2024 of the 1mg  dose before her insurance will allow her to fill the 2mg  dose.   GLP-1 RECEPTOR AGONIST: RESTRICTED TO ON E MEDICATION (GCN/GPI 14) AT A TIME; MUS T USE 75% OF LAST FILL

## 2024-03-08 ENCOUNTER — Ambulatory Visit (INDEPENDENT_AMBULATORY_CARE_PROVIDER_SITE_OTHER): Admitting: Psychology

## 2024-03-08 ENCOUNTER — Encounter: Payer: Self-pay | Admitting: Psychology

## 2024-03-08 DIAGNOSIS — F330 Major depressive disorder, recurrent, mild: Secondary | ICD-10-CM | POA: Diagnosis not present

## 2024-03-08 DIAGNOSIS — F902 Attention-deficit hyperactivity disorder, combined type: Secondary | ICD-10-CM

## 2024-03-08 DIAGNOSIS — F411 Generalized anxiety disorder: Secondary | ICD-10-CM | POA: Diagnosis not present

## 2024-03-08 NOTE — Progress Notes (Signed)
"    [place] [facility] Counselor/Therapist Progress Note  Patient ID: Anna Montgomery, MRN: [id number],    Date: 03/08/2024  Time Spent: 11:05-12:10pm  Pt seen for in person visit.   Treatment Type: Individual Therapy  Reported Symptoms: anxiety, low self confidence, avoidance  Mental Status Exam: Appearance:  Well Groomed     Behavior: Appropriate  Motor: Normal  Speech/Language:  Clear and Coherent and Normal Rate  Affect: Appropriate  Mood: anxious  Thought process: normal  Thought content:   WNL  Sensory/Perceptual disturbances:   WNL  Orientation: oriented to person, place, time/date, and situation  Attention: Good  Concentration: Good  Memory: WNL  Fund of knowledge:  Good  Insight:   Good  Judgment:  Good  Impulse Control: Good   Risk Assessment: Danger to Self:  No Self-injurious Behavior: No Danger to Others: No Duty to Warn:no Physical Aggression / Violence:No  Access to Firearms a concern: No  Gang Involvement:No   Subjective: Counselor assessed pt current functioning per pt report.  Processed w/pt positives, stressors and anxiety.  Provided psychoeducation re: therapeutic process.  Developed tx plan together w/ pt.  Explored recent interactions w/ mom and boyfriend. Discussed referrals to community psychiatrist.  Pt affect wnl.  Pt dicussed goals for counseling and provided verbal and written consent.  Pt requested referral for psychiatrist.  Pt reports recent anxiety and stressors w/ mom and boyfriend in argument last night.  Pt reports felt that wanted to disappear and avoid.  Pt recognized that not her conflict and that both in wrong but not able to mediate and stayed out of.  Pt discussed struggle to assert and identify boundaries she needs to set.  Pt receptive to journaling and focus start w/ gratitude/positive of day.    Interventions: Cognitive Behavioral Therapy, Assertiveness/Communication, Solution-Oriented/Positive Psychology, and journaling and  supportive  Diagnosis:Generalized anxiety disorder  Mild episode of recurrent major depressive disorder  Attention deficit hyperactivity disorder (ADHD), combined type  Plan: pt to f/u w/ counseling at least biweekly.  Pt to f/u w/ PCP as scheduled.  Pt to f/u w/ scheduling w/ psychiatrist.    BARBARANN APPL, [facility]    "

## 2024-03-08 NOTE — Progress Notes (Signed)
 Behavioral Health Treatment Plan   Name:Anna Montgomery  Medicaid # [id number]   MRN: [id number]   Treatment Plan Development Date: 03/08/24   Strengths: Self Advocate, like to draw/colo and play video game.  Her daughter is a positive.    Supports: cousin, mom at times   Client Statement of Needs: pt I don't handle escalated situations well, I'm not confrontational,I don't want to feel emotionally escalated. I want to be a good mom and not take out my stress in interactions with my daughter, I need to unvault things that have been difficulty from my past. It want to be able to speak up for myself more and not be in fight or flight and build more confidence.    Treatment Level:outpatient counseling  Client Treatment Preferences: in person counseling at least biweekly.  I want to work w/ psychiatrist for medication management.    Diagnosis Depressive Disorders  MDD, recurrent, mild  Symptoms:  Depressed mood-indicated by subjective report or observation by others (in children and adolescents, can be irritable mood)., Loss of interest or pleasure in almost all activities-indicated by subjective report or observation by others., Sleep disturbance (insomnia or hypersomnia)., and A sense of worthlessness or excessive, inappropriate, or delusional guilt (not merely self-reproach or guilt about being sick).  Goals:  Develop healthy beliefs and thinking of self and Alleviate depressive symptoms to return to effective functioning.  Objectives: Target Date For All Objectives: 03/08/25  Identify and replace thoughts and beliefs that support depression., Learn and implement strategies to overcome depression., Learn and implement decision-making skills., and Increasingly verbalize hopeful and positive statements regarding self, others, and the future.  Progress Documentation:  Progressing  Interventions:  Cognitive Behavioral Therapy, Dialectical Behavioral Therapy,  Assertiveness/Communication, Mindfulness Meditation, Solution-Oriented/Positive Psychology, Insight-Oriented, and Supportive, and Diagnosis: Anxiety  GAD  Symptoms:  Excessive and/or unrealistic worry that is difficult to control occurring more days than not for at least 6 months about a number of events or activities, Difficulty managing worry, Restlessness or feeling keyed up or on edge, Being easily fatigued, Difficulty concentrating or mind going blank, Irritability, and Sleep disturbance (Difficulty falling or staying asleep, restlessness, or unsatisfying sleep  Goals:  Build and employ tools and skills to reduce symptoms of anxiety, worry, and improve functioning day-to-day.  Objectives: Target Date For All Objectives: 03/08/25  Develop coping tools to manage anxiety including mindfulness, acceptance, relaxation, reframing, and challenging negative thoughts and feelings., Identify, challenge, and manage negative self-talk, negative thinking about self and others, and maintain mindfulness regarding self-talk and cognitive distortions., Develop consistent self-care routine including exercise, healthful eating, consistent sleep., Identify contributing factors to current anxiety including family of origin issues, past trauma, significant stressors, and negative cognitions., and Increased assertiveness and communication skills to manage conflicts  Progress Documentation:  Progressing  Interventions:  CBT - reframing, challenging, cognitive restructuring, DBT, ACT, Relaxation and mindfulness, Distress Tolerance, Communication skills - conflict resolution, Assertiveness, Self-Care - exercise, sleep, nutrition, Strength-based, and supportive   Expected duration of treatment: 1 year  Party responsible for implementation of interventions: pt and counselor.   This plan has been reviewed and created by the following participants: pt and counselor   A new plan will be created at least every  12 months.  The patient fully participated in the development of treatment plan with the clinician and verbally consents to such treatment.   Patient Treatment Plan Signature Obtained: Yes, see S drive for signature, pt provided verbal consent   BARBARANN APPL, [facility]  BARBARANN APPL, [facility]

## 2024-03-19 ENCOUNTER — Encounter: Admitting: Nutrition

## 2024-03-22 ENCOUNTER — Ambulatory Visit: Admitting: Psychology

## 2024-04-05 ENCOUNTER — Ambulatory Visit: Admitting: Psychology

## 2024-06-25 ENCOUNTER — Ambulatory Visit (HOSPITAL_BASED_OUTPATIENT_CLINIC_OR_DEPARTMENT_OTHER): Admitting: Family Medicine

## 2024-06-26 ENCOUNTER — Ambulatory Visit (HOSPITAL_BASED_OUTPATIENT_CLINIC_OR_DEPARTMENT_OTHER): Admitting: Family Medicine
